# Patient Record
Sex: Female | Born: 1975 | Race: Black or African American | Hispanic: No | Marital: Single | Smoking: Never smoker
Health system: Southern US, Community
[De-identification: ages and names within clinical notes are randomized; demographics above are authoritative.]

## PROBLEM LIST (undated history)

## (undated) DIAGNOSIS — J45909 Unspecified asthma, uncomplicated: Secondary | ICD-10-CM

## (undated) DIAGNOSIS — E119 Type 2 diabetes mellitus without complications: Secondary | ICD-10-CM

## (undated) HISTORY — PX: OTHER SURGICAL HISTORY: SHX169

## (undated) HISTORY — PX: TUBAL LIGATION: SHX77

---

## 2003-11-25 ENCOUNTER — Other Ambulatory Visit: Payer: Self-pay

## 2004-05-12 ENCOUNTER — Observation Stay: Payer: Self-pay

## 2004-05-27 ENCOUNTER — Observation Stay: Payer: Self-pay

## 2004-06-07 ENCOUNTER — Inpatient Hospital Stay: Payer: Self-pay | Admitting: Obstetrics & Gynecology

## 2008-01-09 ENCOUNTER — Emergency Department: Payer: Self-pay | Admitting: Emergency Medicine

## 2008-05-22 ENCOUNTER — Emergency Department: Payer: Self-pay | Admitting: Emergency Medicine

## 2010-01-16 ENCOUNTER — Ambulatory Visit: Payer: Self-pay | Admitting: Surgery

## 2010-01-23 ENCOUNTER — Ambulatory Visit: Payer: Self-pay | Admitting: Surgery

## 2010-01-26 LAB — PATHOLOGY REPORT

## 2012-01-06 ENCOUNTER — Emergency Department: Payer: Self-pay | Admitting: Internal Medicine

## 2012-01-16 ENCOUNTER — Emergency Department: Payer: Self-pay | Admitting: Emergency Medicine

## 2012-12-31 ENCOUNTER — Emergency Department: Payer: Self-pay | Admitting: Internal Medicine

## 2013-06-27 ENCOUNTER — Emergency Department: Payer: Self-pay | Admitting: Emergency Medicine

## 2013-06-27 LAB — COMPREHENSIVE METABOLIC PANEL
ALBUMIN: 3.5 g/dL (ref 3.4–5.0)
ALT: 18 U/L (ref 12–78)
Alkaline Phosphatase: 81 U/L
Anion Gap: 5 — ABNORMAL LOW (ref 7–16)
BUN: 10 mg/dL (ref 7–18)
Bilirubin,Total: 0.5 mg/dL (ref 0.2–1.0)
CALCIUM: 9 mg/dL (ref 8.5–10.1)
Chloride: 103 mmol/L (ref 98–107)
Co2: 29 mmol/L (ref 21–32)
Creatinine: 0.9 mg/dL (ref 0.60–1.30)
Glucose: 98 mg/dL (ref 65–99)
Osmolality: 273 (ref 275–301)
Potassium: 3.5 mmol/L (ref 3.5–5.1)
SGOT(AST): 14 U/L — ABNORMAL LOW (ref 15–37)
Sodium: 137 mmol/L (ref 136–145)
Total Protein: 8 g/dL (ref 6.4–8.2)

## 2013-06-27 LAB — CBC WITH DIFFERENTIAL/PLATELET
Basophil #: 0.1 10*3/uL (ref 0.0–0.1)
Basophil %: 1 %
EOS ABS: 0.2 10*3/uL (ref 0.0–0.7)
Eosinophil %: 1.7 %
HCT: 39.1 % (ref 35.0–47.0)
HGB: 12.8 g/dL (ref 12.0–16.0)
Lymphocyte #: 3.1 10*3/uL (ref 1.0–3.6)
Lymphocyte %: 34.2 %
MCH: 30 pg (ref 26.0–34.0)
MCHC: 32.7 g/dL (ref 32.0–36.0)
MCV: 92 fL (ref 80–100)
MONO ABS: 0.7 x10 3/mm (ref 0.2–0.9)
Monocyte %: 8.1 %
NEUTROS ABS: 5 10*3/uL (ref 1.4–6.5)
Neutrophil %: 55 %
Platelet: 271 10*3/uL (ref 150–440)
RBC: 4.26 10*6/uL (ref 3.80–5.20)
RDW: 13.4 % (ref 11.5–14.5)
WBC: 9 10*3/uL (ref 3.6–11.0)

## 2013-06-27 LAB — CK TOTAL AND CKMB (NOT AT ARMC)
CK, TOTAL: 206 U/L — AB
CK-MB: 1.8 ng/mL (ref 0.5–3.6)

## 2013-06-27 LAB — TROPONIN I: Troponin-I: <0.02 ng/mL

## 2015-08-07 IMAGING — CT CT HEAD WITHOUT CONTRAST
1 series · 16 of 29 positions shown, 20 images · non-contrast
Comparison: None.

CLINICAL DATA: Syncope.  Dizziness.

EXAM:
CT HEAD WITHOUT CONTRAST
TECHNIQUE: Contiguous axial images were obtained from the base of the skull
through the vertex without intravenous contrast.

[Series 2: soft tissue · axial · 0.42mm/px · z∈[-176,-46]mm · 16 of 29 slices shown, 20 images]
[im 2/29  brain]
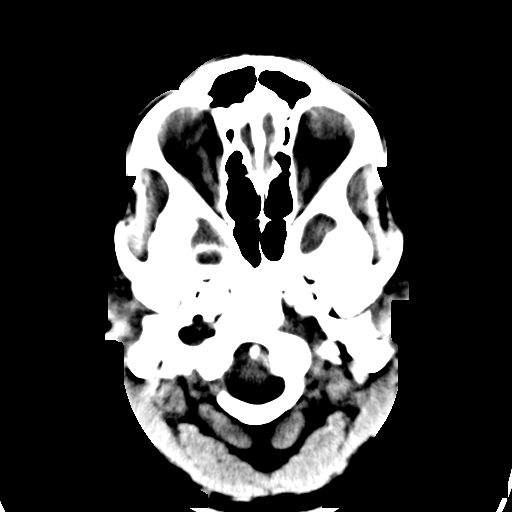
[im 2/29  bone]
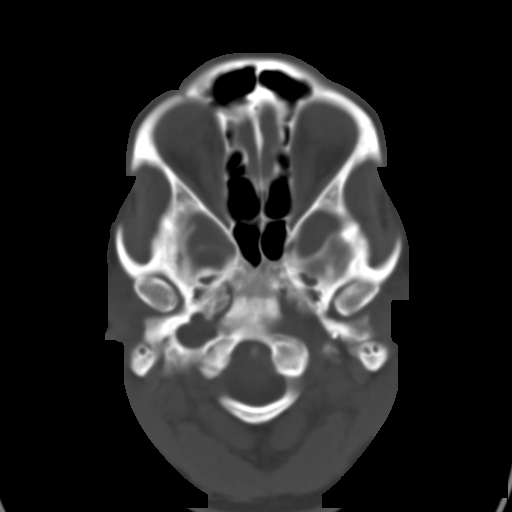
[im 4/29  brain]
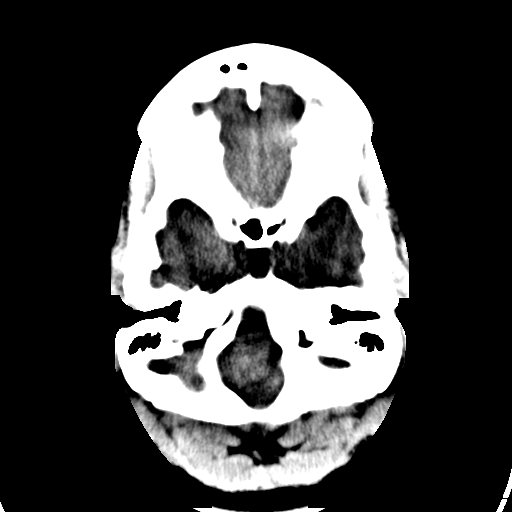
[im 6/29  brain]
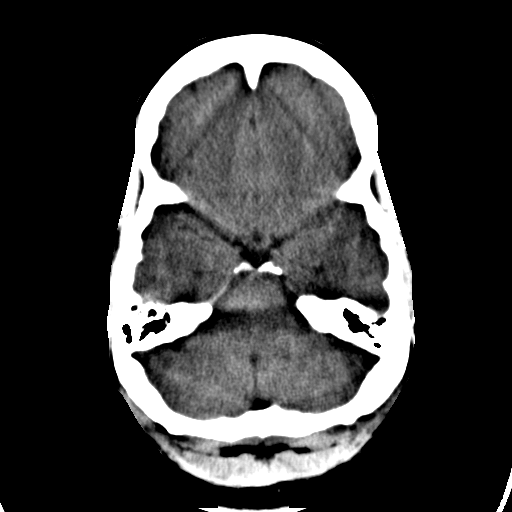
[im 7/29  brain]
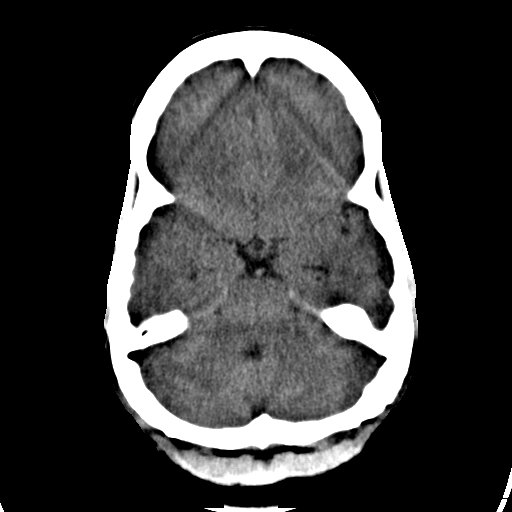
[im 9/29  brain]
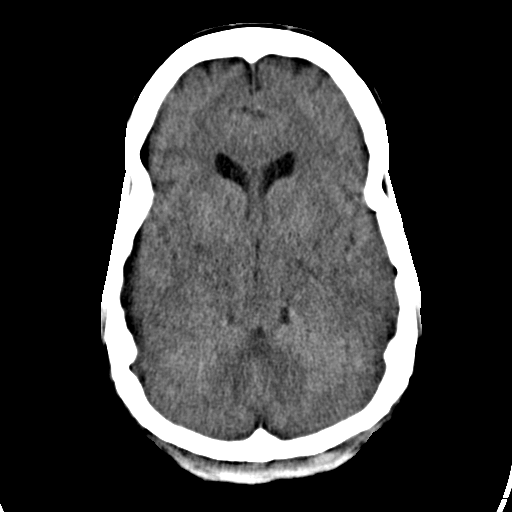
[im 9/29  bone]
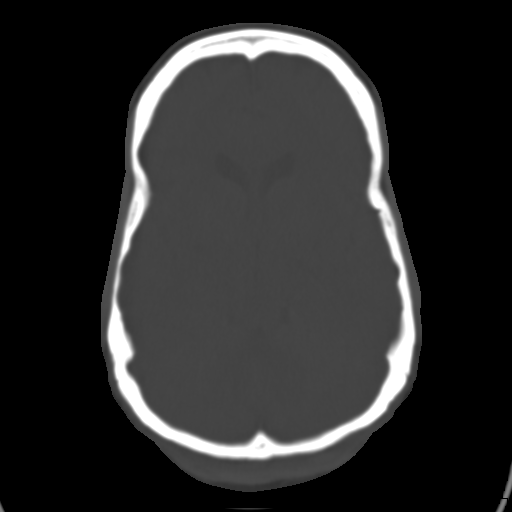
[im 11/29  brain]
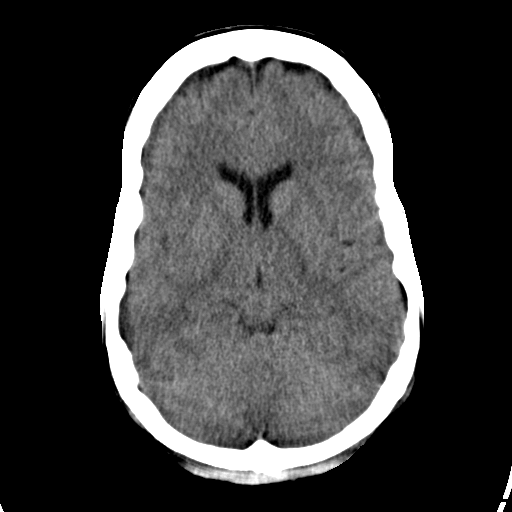
[im 12/29  brain]
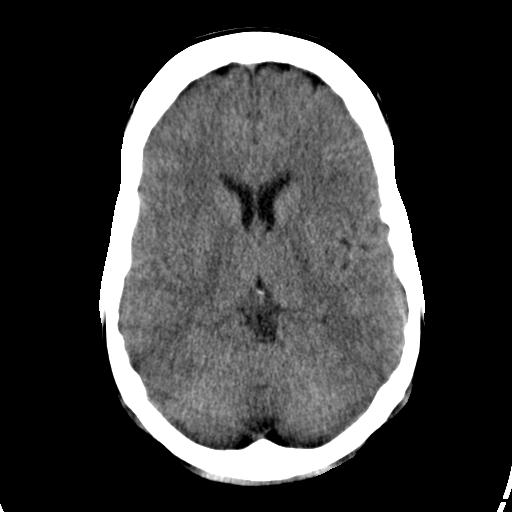
[im 14/29  brain]
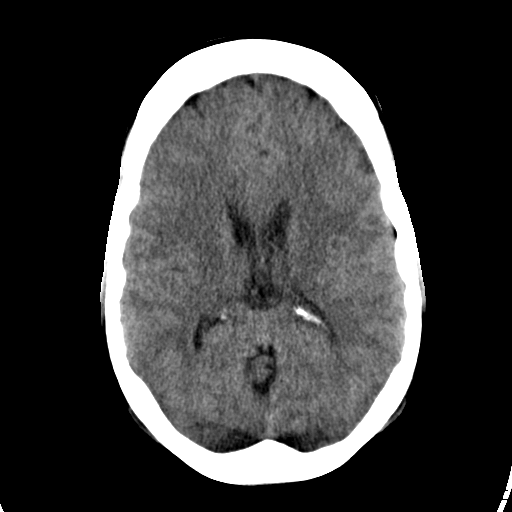
[im 16/29  brain]
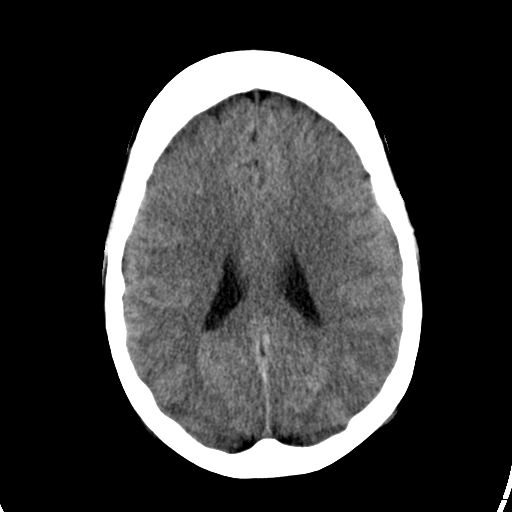
[im 16/29  bone]
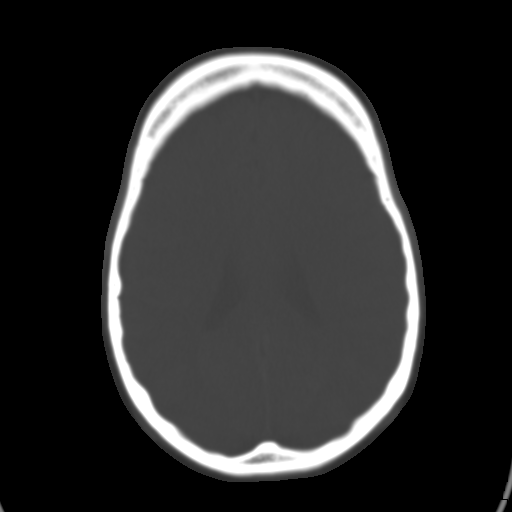
[im 18/29  brain]
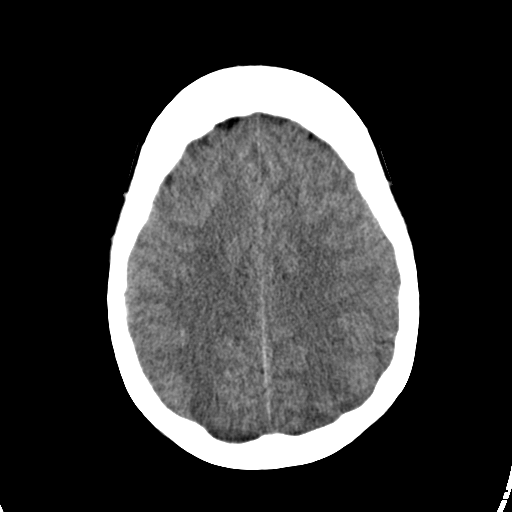
[im 19/29  brain]
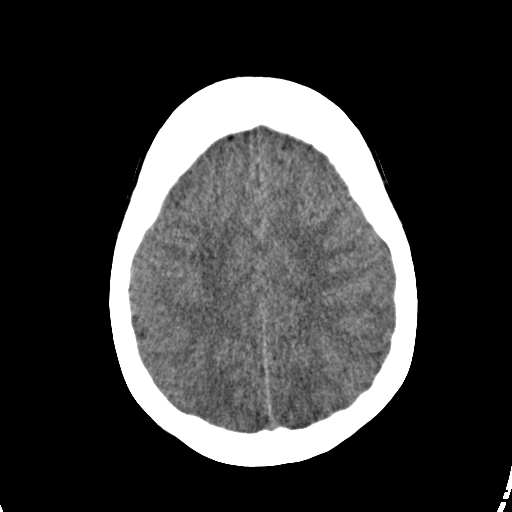
[im 21/29  brain]
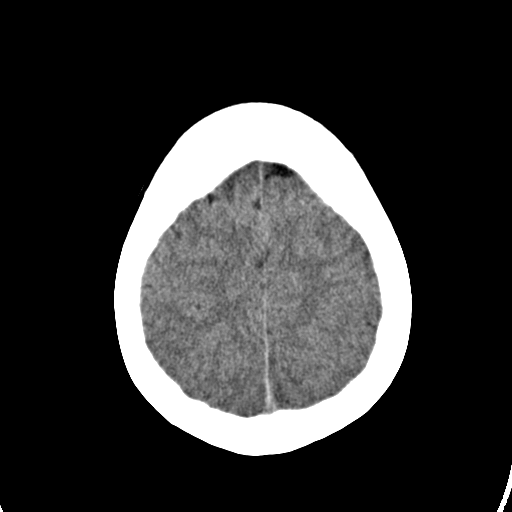
[im 23/29  brain]
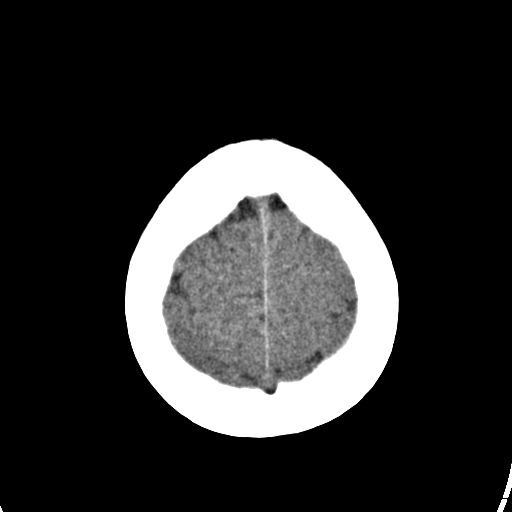
[im 23/29  bone]
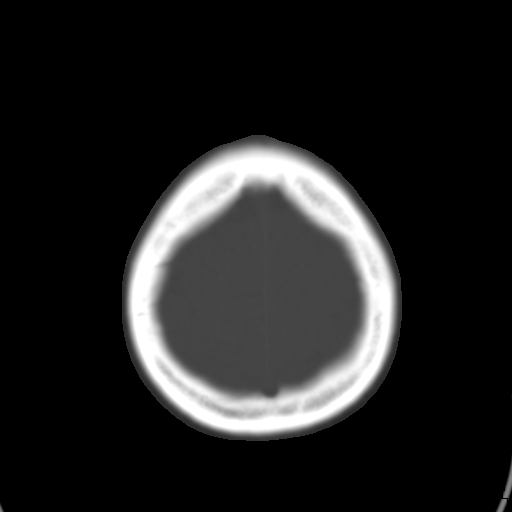
[im 24/29  brain]
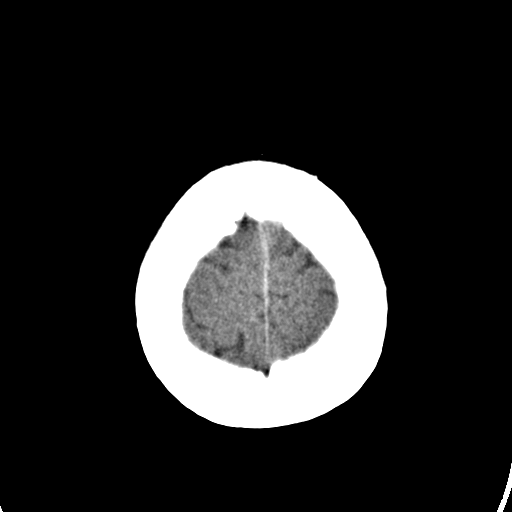
[im 26/29  brain]
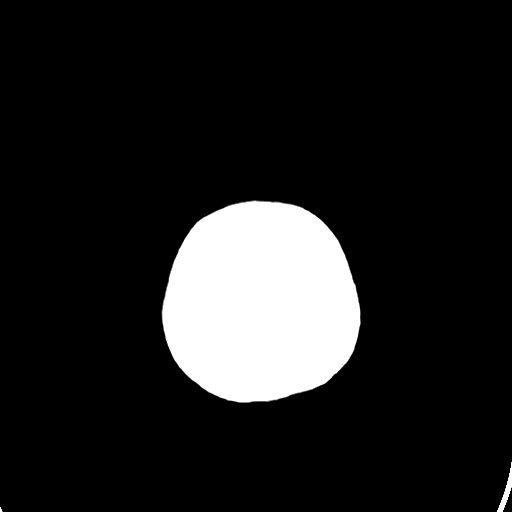
[im 28/29  brain]
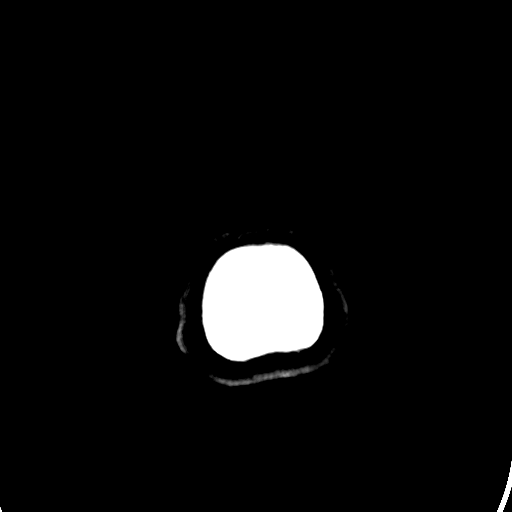

[16 of 29 positions shown; findings below may reference images not displayed]

FINDINGS: The ventricles and sulci are within normal limits for age. There is
no evidence of acute infarct, intracranial hemorrhage, mass, midline
shift, or extra-axial collection. The orbits are unremarkable. The
visualized paranasal sinuses and mastoid air cells are clear. There
is no evidence of acute fracture.
IMPRESSION: Unremarkable head CT.

## 2015-09-29 ENCOUNTER — Encounter: Payer: Self-pay | Admitting: Emergency Medicine

## 2015-09-29 ENCOUNTER — Emergency Department
Admission: EM | Admit: 2015-09-29 | Discharge: 2015-09-29 | Disposition: A | Discharge status: Home / Self Care | Payer: Medicaid Other | Attending: Emergency Medicine | Admitting: Emergency Medicine

## 2015-09-29 DIAGNOSIS — B3731 Acute candidiasis of vulva and vagina: Secondary | ICD-10-CM

## 2015-09-29 DIAGNOSIS — IMO0002 Reserved for concepts with insufficient information to code with codable children: Secondary | ICD-10-CM

## 2015-09-29 DIAGNOSIS — E1065 Type 1 diabetes mellitus with hyperglycemia: Secondary | ICD-10-CM | POA: Insufficient documentation

## 2015-09-29 DIAGNOSIS — B373 Candidiasis of vulva and vagina: Secondary | ICD-10-CM | POA: Diagnosis not present

## 2015-09-29 DIAGNOSIS — Z7984 Long term (current) use of oral hypoglycemic drugs: Secondary | ICD-10-CM | POA: Insufficient documentation

## 2015-09-29 DIAGNOSIS — N898 Other specified noninflammatory disorders of vagina: Secondary | ICD-10-CM | POA: Diagnosis present

## 2015-09-29 DIAGNOSIS — E1069 Type 1 diabetes mellitus with other specified complication: Secondary | ICD-10-CM | POA: Insufficient documentation

## 2015-09-29 LAB — CBC WITH DIFFERENTIAL/PLATELET
BASOS ABS: 0.1 10*3/uL (ref 0–0.1)
Basophils Relative: 1 %
EOS ABS: 0.1 10*3/uL (ref 0–0.7)
EOS PCT: 1 %
HCT: 39.2 % (ref 35.0–47.0)
Hemoglobin: 13.5 g/dL (ref 12.0–16.0)
LYMPHS PCT: 28 %
Lymphs Abs: 3 10*3/uL (ref 1.0–3.6)
MCH: 31 pg (ref 26.0–34.0)
MCHC: 34.3 g/dL (ref 32.0–36.0)
MCV: 90.4 fL (ref 80.0–100.0)
MONO ABS: 0.5 10*3/uL (ref 0.2–0.9)
Monocytes Relative: 5 %
Neutro Abs: 6.8 10*3/uL — ABNORMAL HIGH (ref 1.4–6.5)
Neutrophils Relative %: 65 %
PLATELETS: 246 10*3/uL (ref 150–440)
RBC: 4.34 MIL/uL (ref 3.80–5.20)
RDW: 13.2 % (ref 11.5–14.5)
WBC: 10.5 10*3/uL (ref 3.6–11.0)

## 2015-09-29 LAB — URINALYSIS COMPLETE WITH MICROSCOPIC (ARMC ONLY)
BACTERIA UA: NONE SEEN
BILIRUBIN URINE: NEGATIVE
Glucose, UA: >500 mg/dL — AB
HGB URINE DIPSTICK: NEGATIVE
LEUKOCYTES UA: NEGATIVE
NITRITE: NEGATIVE
PH: 6 (ref 5.0–8.0)
PROTEIN: NEGATIVE mg/dL
SQUAMOUS EPITHELIAL / LPF: NONE SEEN
Specific Gravity, Urine: 1.025 (ref 1.005–1.030)

## 2015-09-29 LAB — BASIC METABOLIC PANEL
ANION GAP: 10 (ref 5–15)
BUN: 15 mg/dL (ref 6–20)
CALCIUM: 9.2 mg/dL (ref 8.9–10.3)
CO2: 22 mmol/L (ref 22–32)
Chloride: 93 mmol/L — ABNORMAL LOW (ref 101–111)
Creatinine, Ser: 0.89 mg/dL (ref 0.44–1.00)
GFR calc Af Amer: >60 mL/min (ref >60)
GLUCOSE: 536 mg/dL — AB (ref 65–99)
Potassium: 4 mmol/L (ref 3.5–5.1)
SODIUM: 125 mmol/L — AB (ref 135–145)

## 2015-09-29 LAB — GLUCOSE, CAPILLARY
GLUCOSE-CAPILLARY: 564 mg/dL — AB (ref 65–99)
Glucose-Capillary: 337 mg/dL — ABNORMAL HIGH (ref 65–99)

## 2015-09-29 LAB — POCT PREGNANCY, URINE: PREG TEST UR: NEGATIVE

## 2015-09-29 MED ORDER — SODIUM CHLORIDE 0.9 % IV BOLUS (SEPSIS)
1000.0000 mL | Freq: Once | INTRAVENOUS | Status: AC
  Administered 2015-09-29: 1000 mL via INTRAVENOUS

## 2015-09-29 MED ORDER — FLUCONAZOLE 100 MG PO TABS: 100.0000 mg | ORAL_TABLET | Freq: Every day | ORAL | Status: DC

## 2015-09-29 NOTE — Discharge Instructions (Signed)
Diabetes Mellitus and Food It is important for you to manage your blood sugar (glucose) level. Your blood glucose level can be greatly affected by what you eat. Eating healthier foods in the appropriate amounts throughout the day at about the same time each day will help you control your blood glucose level. It can also help slow or prevent worsening of your diabetes mellitus. Healthy eating may even help you improve the level of your blood pressure and reach or maintain a healthy weight.  General recommendations for healthful eating and cooking habits include:  Eating meals and snacks regularly. Avoid going long periods of time without eating to lose weight.  Eating a diet that consists mainly of plant-based foods, such as fruits, vegetables, nuts, legumes, and whole grains.  Using low-heat cooking methods, such as baking, instead of high-heat cooking methods, such as deep frying. Work with your dietitian to make sure you understand how to use the Nutrition Facts information on food labels. HOW CAN FOOD AFFECT ME? Carbohydrates Carbohydrates affect your blood glucose level more than any other type of food. Your dietitian will help you determine how many carbohydrates to eat at each meal and teach you how to count carbohydrates. Counting carbohydrates is important to keep your blood glucose at a healthy level, especially if you are using insulin or taking certain medicines for diabetes mellitus. Alcohol Alcohol can cause sudden decreases in blood glucose (hypoglycemia), especially if you use insulin or take certain medicines for diabetes mellitus. Hypoglycemia can be a life-threatening condition. Symptoms of hypoglycemia (sleepiness, dizziness, and disorientation) are similar to symptoms of having too much alcohol.  If your health care provider has given you approval to drink alcohol, do so in moderation and use the following guidelines:  Women should not have more than one drink per day, and men  should not have more than two drinks per day. One drink is equal to:  12 oz of beer.  5 oz of wine.  1 oz of hard liquor.  Do not drink on an empty stomach.  Keep yourself hydrated. Have water, diet soda, or unsweetened iced tea.  Regular soda, juice, and other mixers might contain a lot of carbohydrates and should be counted. WHAT FOODS ARE NOT RECOMMENDED? As you make food choices, it is important to remember that all foods are not the same. Some foods have fewer nutrients per serving than other foods, even though they might have the same number of calories or carbohydrates. It is difficult to get your body what it needs when you eat foods with fewer nutrients. Examples of foods that you should avoid that are high in calories and carbohydrates but low in nutrients include:  Trans fats (most processed foods list trans fats on the Nutrition Facts label).  Regular soda.  Juice.  Candy.  Sweets, such as cake, pie, doughnuts, and cookies.  Fried foods. WHAT FOODS CAN I EAT? Eat nutrient-rich foods, which will nourish your body and keep you healthy. The food you should eat also will depend on several factors, including:  The calories you need.  The medicines you take.  Your weight.  Your blood glucose level.  Your blood pressure level.  Your cholesterol level. You should eat a variety of foods, including:  Protein.  Lean cuts of meat.  Proteins low in saturated fats, such as fish, egg whites, and beans. Avoid processed meats.  Fruits and vegetables.  Fruits and vegetables that may help control blood glucose levels, such as apples, mangoes, and  yams.  Dairy products.  Choose fat-free or low-fat dairy products, such as milk, yogurt, and cheese.  Grains, bread, pasta, and rice.  Choose whole grain products, such as multigrain bread, whole oats, and brown rice. These foods may help control blood pressure.  Fats.  Foods containing healthful fats, such as nuts,  avocado, olive oil, canola oil, and fish. DOES EVERYONE WITH DIABETES MELLITUS HAVE THE SAME MEAL PLAN? Because every person with diabetes mellitus is different, there is not one meal plan that works for everyone. It is very important that you meet with a dietitian who will help you create a meal plan that is just right for you.   This information is not intended to replace advice given to you by your health care provider. Make sure you discuss any questions you have with your health care provider.   Document Released: 12/10/2004 Document Revised: 04/05/2014 Document Reviewed: 02/09/2013 Elsevier Interactive Patient Education 2016 Elsevier Inc[employer]  Daily Diabetes Record Check your blood glucose (BG) as directed by your health care provider. Use this form to record your results as well as any diabetes medicines you take, including insulin. Checking your BG, recording it, and bringing your records to your health care provider is very helpful in managing your diabetes. These numbers help your health care provider know if any changes are needed to your diabetes plan.  Week of _____________________________ Date: _________  Chelsea Nash, BG/Medicines: ________________ / __________________________________________________________  LUNCH, BG/Medicines: ____________________ / __________________________________________________________  Chelsea Nash, BG/Medicines: ___________________ / __________________________________________________________  BEDTIME, BG/Medicines: __________________ / __________________________________________________________ Date: _________  Chelsea Nash, BG/Medicines: ________________ / __________________________________________________________  LUNCH, BG/Medicines: ____________________ / __________________________________________________________  Chelsea Nash, BG/Medicines: ___________________ / __________________________________________________________  BEDTIME, BG/Medicines: __________________  / __________________________________________________________ Date: _________  Chelsea Nash, BG/Medicines: ________________ / __________________________________________________________  LUNCH, BG/Medicines: ____________________ / __________________________________________________________  Chelsea Nash, BG/Medicines: ___________________ / __________________________________________________________  BEDTIME, BG/Medicines: __________________ / __________________________________________________________ Date: _________  Chelsea Nash, BG/Medicines: ________________ / __________________________________________________________  LUNCH, BG/Medicines: ____________________ / __________________________________________________________  Chelsea Nash, BG/Medicines: ___________________ / __________________________________________________________  BEDTIME, BG/Medicines: __________________ / __________________________________________________________ Date: _________  Chelsea Nash, BG/Medicines: ________________ / __________________________________________________________  LUNCH, BG/Medicines: ____________________ / __________________________________________________________  Chelsea Nash, BG/Medicines: ___________________ / __________________________________________________________  BEDTIME, BG/Medicines: __________________ / __________________________________________________________ Date: _________  Chelsea Nash, BG/Medicines: ________________ / __________________________________________________________  LUNCH, BG/Medicines: ____________________ / __________________________________________________________  Chelsea Nash, BG/Medicines: ___________________ / __________________________________________________________  BEDTIME, BG/Medicines: __________________ / __________________________________________________________ Date: _________  Chelsea Nash, BG/Medicines: ________________ /  __________________________________________________________  LUNCH, BG/Medicines: ____________________ / __________________________________________________________  Chelsea Nash, BG/Medicines: ___________________ / __________________________________________________________  BEDTIME, BG/Medicines: __________________ / __________________________________________________________ Notes: __________________________________________________________________________________________________   This information is not intended to replace advice given to you by your health care provider. Make sure you discuss any questions you have with your health care provider.   Document Released: 02/17/2004 Document Revised: 04/05/2014 Document Reviewed: 05/09/2013 Elsevier Interactive Patient Education 2016 Elsevier Inc.  Hyperglycemia High blood sugar (hyperglycemia) means that the level of sugar in your blood is higher than it should be. Signs of high blood sugar include:  Feeling thirsty.  Frequent peeing (urinating).  Feeling tired or sleepy.  Dry mouth.  Vision changes.  Feeling weak.  Feeling hungry but losing weight.  Numbness and tingling in your hands or feet.  Headache. When you ignore these signs, your blood sugar may keep going up. These problems may get worse, and other problems may begin. HOME CARE  Check your blood sugars as told by your doctor. Write down the numbers with the date and time.  Take the right amount of insulin or diabetes pills at the right time. Write down the dose with  date and time.  Refill your insulin or diabetes pills before running out.  Watch what you eat. Follow your meal plan.  Drink liquids without sugar, such as water. Check with your doctor if you have kidney or heart disease.  Follow your doctor's orders for exercise. Exercise at the same time of day.  Keep your doctor's appointments. GET HELP RIGHT AWAY IF:   You have trouble thinking or are  confused.  You have fast breathing with fruity smelling breath.  You pass out (faint).  You have 2 to 3 days of high blood sugars and you do not know why.  You have chest pain.  You are feeling sick to your stomach (nauseous) or throwing up (vomiting).  You have sudden vision changes. MAKE SURE YOU:   Understand these instructions.  Will watch your condition.  Will get help right away if you are not doing well or get worse.   This information is not intended to replace advice given to you by your health care provider. Make sure you discuss any questions you have with your health care provider.   Document Released: 01/10/2009 Document Revised: 04/05/2014 Document Reviewed: 11/19/2014 Elsevier Interactive Patient Education 2016 [employer]  How to Avoid Diabetes Problems You can do a lot to prevent or slow down diabetes problems. Following your diabetes plan and taking care of yourself can reduce your risk of serious or life-threatening complications. Below, you will find certain things you can do to prevent diabetes problems. MANAGE YOUR DIABETES Follow your health care provider's, nurse educator's, and dietitian's instructions for managing your diabetes. They will teach you the basics of diabetes care. They can help answer questions you may have. Learn about diabetes and make healthy choices regarding eating and physical activity. Monitor your blood glucose level regularly. Your health care provider will help you decide how often to check your blood glucose level depending on your treatment goals and how well you are meeting them.  DO NOT USE NICOTINE Nicotine and diabetes are a dangerous combination. Nicotine raises your risk for diabetes problems. If you quit using nicotine, you will lower your risk for heart attack, stroke, nerve disease, and kidney disease. Your cholesterol and your blood pressure levels may improve. Your blood circulation will also improve. Do not use any  tobacco products, including cigarettes, chewing tobacco, or electronic cigarettes. If you need help quitting, ask your health care provider. KEEP YOUR BLOOD PRESSURE UNDER CONTROL Your health care provider will determine your individualized target blood pressure based on your age, your medicines, how long you have had diabetes, and any other medical conditions you have. Blood pressure consists of two numbers. Generally, the goal is to keep your top number (systolic pressure) at or below 130, and your bottom number (diastolic pressure) at or below 80. Your health care provider may recommend a lower target blood pressure reading, if appropriate. Meal planning, medicines, and exercise can help you reach your target blood pressure. Make sure your health care provider checks your blood pressure at every visit. KEEP YOUR CHOLESTEROL UNDER CONTROL Normal cholesterol levels will help prevent heart disease and stroke. These are the biggest health problems for people with diabetes. Keeping cholesterol levels under control can also help with blood flow. Have your cholesterol level checked at least once a year. Your health care provider may prescribe a medicine known as a statin. Statins lower your cholesterol. If you are not taking a statin, ask your health care provider if you should be. Meal planning,  exercise, and medicines can help you reach your cholesterol targets.  SCHEDULE AND KEEP YOUR ANNUAL PHYSICAL EXAMS AND EYE EXAMS Your health care provider will tell you how often he or she wants to see you depending on your plan of treatment. It is important that you keep these appointments so that possible problems can be identified early and complications can be avoided or treated.  Every visit with your health care provider should include your weight, blood pressure, and an evaluation of your blood glucose control.  Your hemoglobin A1c should be checked:  At least twice a year if you are at your goal.  Every 3  months if there are changes in treatment.  If you are not meeting your goals.  Your blood lipids should be checked yearly. You should also be checked yearly to see if you have protein in your urine (microalbumin).  Schedule a dilated eye exam within 5 years of your diagnosis if you have type 1 diabetes, and then yearly. Schedule a dilated eye exam at diagnosis if you have type 2 diabetes, and then yearly. All exams thereafter can be extended to every 2 to 3 years if one or more exams have been normal. KEEP YOUR VACCINES CURRENT It is recommended that you receive a flu (influenza) vaccine every year. It is also recommended that you receive a pneumonia (pneumococcal) vaccine. If you are 83 years of age or older and have never received a pneumonia vaccine, this vaccine may be given as a series of two separate shots. Ask your health care provider which additional vaccines may be recommended. TAKE CARE OF YOUR FEET  Diabetes may cause you to have a poor blood supply (circulation) to your legs and feet. Because of this, the skin may be thinner, break easier, and heal more slowly. You also may have nerve damage in your legs and feet, causing decreased feeling. You may not notice minor injuries to your feet that could lead to serious problems or infections. Taking care of your feet is very important. Visual foot exams are performed at every routine medical visit. The exams check for cuts, injuries, or other problems with the feet. A comprehensive foot exam should be done yearly. This includes visual inspection as well as assessing foot pulses and testing for loss of sensation. You should also do the following:  Inspect your feet daily for cuts, calluses, blisters, ingrown toenails, and signs of infection, such as redness, swelling, or pus.  Wash and dry your feet thoroughly, especially between the toes.  Avoid soaking your feet regularly in hot water baths.  Moisturize dry skin with lotion, avoiding areas  between your toes.  Cut toenails straight across and file the edges.  Avoid shoes that do not fit well or have areas that irritate your skin.  Avoid going barefooted or wearing only socks. Your feet need protection. TAKE CARE OF YOUR TEETH People with poorly controlled diabetes are more likely to have gum (periodontal) disease. These infections make diabetes harder to control. Periodontal diseases, if left untreated, can lead to tooth loss. Brush your teeth twice a day, floss, and see your dentist for checkups and cleaning every 6 months, or 2 times a year. ASK YOUR HEALTH CARE PROVIDER ABOUT TAKING ASPIRIN Taking aspirin daily is recommended to help prevent cardiovascular disease in people with and without diabetes. Ask your health care provider if this would benefit you and what dose he or she would recommend. DRINK RESPONSIBLY Moderate amounts of alcohol (less than 1 drink  per day for adult women and less than 2 drinks per day for adult men) have a minimal effect on blood glucose if ingested with food. It is important to eat food with alcohol to avoid hypoglycemia. People should avoid alcohol if they have a history of alcohol abuse or dependence, if they are pregnant, and if they have liver disease, pancreatitis, advanced neuropathy, or severe hypertriglyceridemia. LESSEN STRESS Living with diabetes can be stressful. When you are under stress, your blood glucose may be affected in two ways:  Stress hormones may cause your blood glucose to rise.  You may be distracted from taking good care of yourself. It is a good idea to be aware of your stress level and make changes that are necessary to help you better manage challenging situations. Support groups, planned relaxation, a hobby you enjoy, meditation, healthy relationships, and exercise all work to lower your stress level. If your efforts do not seem to be helping, get help from your health care provider or a trained mental health  professional.   This information is not intended to replace advice given to you by your health care provider. Make sure you discuss any questions you have with your health care provider.   Document Released: 12/01/2010 Document Revised: 04/05/2014 Document Reviewed: 05/09/2013 Elsevier Interactive Patient Education 2016 Elsevier Inc.  Monilial Vaginitis Vaginitis in a soreness, swelling and redness (inflammation) of the vagina and vulva. Monilial vaginitis is not a sexually transmitted infection. CAUSES  Yeast vaginitis is caused by yeast (candida) that is normally found in your vagina. With a yeast infection, the candida has overgrown in number to a point that upsets the chemical balance. SYMPTOMS   White, thick vaginal discharge.  Swelling, itching, redness and irritation of the vagina and possibly the lips of the vagina (vulva).  Burning or painful urination.  Painful intercourse. DIAGNOSIS  Things that may contribute to monilial vaginitis are:  Postmenopausal and virginal states.  Pregnancy.  Infections.  Being tired, sick or stressed, especially if you had monilial vaginitis in the past.  Diabetes. Good control will help lower the chance.  Birth control pills.  Tight fitting garments.  Using bubble bath, feminine sprays, douches or deodorant tampons.  Taking certain medications that kill germs (antibiotics).  Sporadic recurrence can occur if you become ill. TREATMENT  Your caregiver will give you medication.  There are several kinds of anti monilial vaginal creams and suppositories specific for monilial vaginitis. For recurrent yeast infections, use a suppository or cream in the vagina 2 times a week, or as directed.  Anti-monilial or steroid cream for the itching or irritation of the vulva may also be used. Get your caregiver's permission.  Painting the vagina with methylene blue solution may help if the monilial cream does not work.  Eating yogurt may help  prevent monilial vaginitis. HOME CARE INSTRUCTIONS   Finish all medication as prescribed.  Do not have sex until treatment is completed or after your caregiver tells you it is okay.  Take warm sitz baths.  Do not douche.  Do not use tampons, especially scented ones.  Wear cotton underwear.  Avoid tight pants and panty hose.  Tell your sexual partner that you have a yeast infection. They should go to their caregiver if they have symptoms such as mild rash or itching.  Your sexual partner should be treated as well if your infection is difficult to eliminate.  Practice safer sex. Use condoms.  Some vaginal medications cause latex condoms to fail. Vaginal  medications that harm condoms are:  Cleocin cream.  Butoconazole (Femstat).  Terconazole (Terazol) vaginal suppository.  Miconazole (Monistat) (may be purchased over the counter). SEEK MEDICAL CARE IF:   You have a temperature by mouth above 102 F (38.9 C).  The infection is getting worse after 2 days of treatment.  The infection is not getting better after 3 days of treatment.  You develop blisters in or around your vagina.  You develop vaginal bleeding, and it is not your menstrual period.  You have pain when you urinate.  You develop intestinal problems.  You have pain with sexual intercourse.   This information is not intended to replace advice given to you by your health care provider. Make sure you discuss any questions you have with your health care provider.   Document Released: 12/23/2004 Document Revised: 06/07/2011 Document Reviewed: 09/16/2014 Elsevier Interactive Patient Education 2016 Else[employer] Inc.  Probiotics WHAT ARE PROBIOTICS? Probiotics are the good bacteria and yeasts that live in your body and keep you and your digestive system healthy. Probiotics also help your body's defense (immune) system and protect your body against bad bacterial growth.  Certain foods contain probiotics, such as  yogurt. Probiotics can also be purchased as a supplement. As with any supplement or drug, it is important to discuss its use with your health care provider.  WHAT AFFECTS THE BALANCE OF BACTERIA IN MY BODY? The balance of bacteria in your body can be affected by:   Antibiotic medicines. Antibiotics are sometimes necessary to treat infection. Unfortunately, they may kill good or friendly bacteria in your body as well as the bad bacteria. This may lead to stomach problems like diarrhea, gas, and cramping.  Disease. Some conditions are the result of an overgrowth of bad bacteria, yeasts, parasites, or fungi. These conditions include:   Infectious diarrhea.  Stomach and respiratory infections.  Skin infections.  Irritable bowel syndrome (IBS).  Inflammatory bowel diseases.  Ulcer due to Helicobacter pylori (H. pylori) infection.  Tooth decay and periodontal disease.  Vaginal infections. Stress and poor diet may also lower the good bacteria in your body.  WHAT TYPE OF PROBIOTIC IS RIGHT FOR ME? Probiotics are available over the counter at your local pharmacy, health food, or grocery store. They come in many different forms, combinations of strains, and dosing strengths. Some may need to be refrigerated. Always read the label for storage and usage instructions. Specific strains have been shown to be more effective for certain conditions. Ask your health care provider what option is best for you.  WHY WOULD I NEED PROBIOTICS? There are many reasons your health care provider might recommend a probiotic supplement, including:   Diarrhea.  Constipation.  IBS.  Respiratory infections.  Yeast infections.  Acne, eczema, and other skin conditions.  Frequent urinary tract infections (UTIs). ARE THERE SIDE EFFECTS OF PROBIOTICS? Some people experience mild side effects when taking probiotics. Side effects are usually temporary and may include:   Gas.  Bloating.  Cramping. Rarely,  serious side effects, such as infection or immune system changes, may occur. WHAT ELSE DO I NEED TO KNOW ABOUT PROBIOTICS?   There are many different strains of probiotics. Certain strains may be more effective depending on your condition. Probiotics are available in varying doses. Ask your health care provider which probiotic you should use and how often.   If you are taking probiotics along with antibiotics, it is generally recommended to wait at least 2 hours between taking the antibiotic and taking the  probiotic.  FOR MORE INFORMATION:  [facility] [email or web address]   This information is not intended to replace advice given to you by your health care provider. Make sure you discuss any questions you have with your health care provider.   Document Released: 10/10/2013 Document Reviewed: 10/10/2013 Elsevier Interactive Patient Education 2016 [employer].  Type 1 Diabetes Mellitus, Adult Type 1 diabetes mellitus, often simply referred to as diabetes, is a long-term (chronic) disease. It occurs when the islet cells in the pancreas that make insulin (a hormone) are destroyed and can no longer make insulin. Insulin is needed to move sugars from food into the tissue cells. The tissue cells use the sugars for energy. In people with type 1 diabetes, the sugars build up in the blood instead of going into the tissue cells. As a result, high blood sugar (hyperglycemia) develops. Without insulin, the body breaks down fat cells for the needed energy. This breakdown of fat cells produces acid chemicals (ketones), which increases the acid levels in the body. The effect of either high ketone or high sugar (glucose) levels can be life-threatening.  Type 1 diabetes was also previously called juvenile diabetes. It most often occurs before the age of 8, but it can occur at any age. RISK FACTORS A person is predisposed to developing type 1 diabetes if someone  in his or her family has the disease and is exposed to certain additional environmental triggers.  SYMPTOMS  Symptoms of type 1 diabetes may develop gradually over days to weeks or suddenly. The symptoms occur due to hyperglycemia. The symptoms can include:   Increased thirst (polydipsia).  Increased urination (polyuria).  Increased urination during the night (nocturia).  Weight loss. This weight loss may be rapid.  Frequent, recurring infections.  Tiredness (fatigue).  Weakness.  Vision changes, such as blurred vision.  Fruity smell to your breath.  Abdominal pain.  Nausea or vomiting.  An open skin wound (ulcer). DIAGNOSIS  Type 1 diabetes is diagnosed when symptoms of diabetes are present and when blood glucose levels are increased. Your blood glucose level may be checked by one or more of the following blood tests:  A fasting blood glucose test. You will not be allowed to eat for at least 8 hours before a blood sample is taken.  A random blood glucose test. Your blood glucose is checked at any time of the day regardless of when you ate.  A hemoglobin A1c blood glucose test. A hemoglobin A1c test provides information about blood glucose control over the previous 3 months. TREATMENT  Although type 1 diabetes cannot be prevented, it can be managed with insulin, diet, and exercise.  You will need to take insulin daily to keep blood glucose in the desired range.  You will need to match insulin dosing with exercise and healthy food choices. Generally, the goal of treatment is to maintain a pre-meal (preprandial) blood glucose level of 80-130 mg/dL. HOME CARE INSTRUCTIONS   Have your hemoglobin A1c level checked twice a year.  Perform daily blood glucose monitoring as directed by your health care provider.  Monitor urine ketones when you are ill and as directed by your health care provider.  Take your insulin as directed by your health care provider to maintain your  blood glucose level in the desired range.  Never run out of insulin. It is needed every day.  Adjust insulin based on your intake of carbohydrates. Carbohydrates can raise blood glucose levels but need to  be included in your diet. Carbohydrates provide vitamins, minerals, and fiber, which are an essential part of a healthy diet. Carbohydrates are found in fruits, vegetables, whole grains, dairy products, legumes, and foods containing added sugars.  Eat healthy foods. Alternate 3 meals with 3 snacks.  Maintain a healthy weight.  Carry a medical alert card or wear your medical alert jewelry.  Carry a 15-gram carbohydrate snack with you at all times to treat low blood glucose (hypoglycemia). Some examples of 15-gram carbohydrate snacks include:  Glucose tablets, 3 or 4.  Glucose gel, 15-gram tube.  Raisins, 2 tablespoons (24 grams).  Jelly beans, 6.  Animal crackers, 8.  Fruit juice, regular soda, or low-fat milk, 4 ounces (120 mL).  Gummy treats, 9.  Recognize hypoglycemia. Hypoglycemia occurs with blood glucose levels of 70 mg/dL and below. The risk for hypoglycemia increases when fasting or skipping meals, during or after intense exercise, and during sleep. Hypoglycemia symptoms can include:  Tremors or shakes.  Decreased ability to concentrate.  Sweating.  Increased heart rate.  Headache.  Dry mouth.  Hunger.  Irritability.  Anxiety.  Restless sleep.  Altered speech or coordination.  Confusion.  Treat hypoglycemia promptly. If you are alert and able to safely swallow, follow the 15:15 rule:  Take 15-20 grams of rapid-acting glucose or carbohydrate. Rapid-acting options include glucose gel, glucose tablets, or 4 ounces (120 mL) of fruit juice, regular soda, or low-fat milk.  Check your blood glucose level 15 minutes after taking the glucose.  Take 15-20 grams more of glucose if the repeat blood glucose level is still 70 mg/dL or below.  Eat a meal or  snack within 1 hour once blood glucose levels return to normal.  Be alert to polyuria and polydipsia, which are early signs of hyperglycemia. An early awareness of hyperglycemia allows for prompt treatment. Treat hyperglycemia as directed by your health care provider.  Exercise regularly as directed by your health care provider. This includes:  Stretching and performing strength training exercises, such as yoga or weight lifting, at least 2 times per week.  Performing a total of at least 150 minutes of moderate-intensity exercise each week, such as brisk walking or water aerobics.  Exercising at least 3 days per week, making sure you allow no more than 2 consecutive days to pass without exercising.  Avoiding long periods of inactivity (90 minutes or more). When you have to spend an extended period of time sitting down, take frequent breaks to walk or stretch.  Adjust your insulin dosing and food intake as needed if you start a new exercise or sport.  Follow your sick-day plan at any time you are unable to eat or drink as usual.   Do not use any tobacco products including cigarettes, chewing tobacco, or electronic cigarettes. If you need help quitting, ask your health care provider.  Limit alcohol intake to no more than 1 drink per day for nonpregnant women and 2 drinks per day for men. You should drink alcohol only when you are also eating food. Talk with your health care provider about whether alcohol is safe for you. Tell your health care provider if you drink alcohol several times a week.  Keep all follow-up visits as directed by your health care provider.  Schedule an eye exam within 5 years of diagnosis and then annually.  Perform daily skin and foot care. Examine your skin and feet daily for cuts, bruises, redness, nail problems, bleeding, blisters, or sores. A foot exam should be  done by a health care provider 5 years after diagnosis, and then every year after the first  exam.  Brush your teeth and gums at least twice a day and floss at least once a day. Follow up with your dentist regularly.  Share your diabetes management plan with your workplace or school.  Keep your immunizations up to date. It is recommended that you receive a flu (influenza) vaccine every year. It is also recommended that you receive a pneumonia (pneumococcal) vaccine. If you are 39 years of age or older and have never received a pneumonia vaccine, this vaccine may be given as a series of two separate shots. Ask your health care provider which additional vaccines may be recommended.  Learn to manage stress.  Obtain ongoing diabetes education and support as needed.  Participate in or seek rehabilitation as needed to maintain or improve independence and quality of life. Request a physical or occupational therapy referral if you are having foot or hand numbness, or difficulties with grooming, dressing, eating, or physical activity. SEEK MEDICAL CARE IF:   You are unable to eat food or drink fluids for more than 6 hours.  You have nausea and vomiting for more than 6 hours.  Your blood glucose level is over 240 mg/dL.  There is a change in mental status.  You develop an additional serious illness.  You have diarrhea for more than 6 hours.  You have been sick or have had a fever for a couple of days and are not getting better.  You have pain during any physical activity. SEEK IMMEDIATE MEDICAL CARE IF:  You have difficulty breathing.  You have moderate to large ketone levels. MAKE SURE YOU:  Understand these instructions.  Will watch your condition.  Will get help right away if you are not doing well or get worse.   This information is not intended to replace advice given to you by your health care provider. Make sure you discuss any questions you have with your health care provider.   Document Released: 03/12/2000 Document Revised: 12/04/2014 Document Reviewed:  10/12/2011 Elsevier Interactive Patient Education [employer]2016 [employer]

## 2015-09-29 NOTE — ED Notes (Signed)
States she developed a possible yeast infection about 5 days ago  States she is tired and having some blurred vision

## 2015-09-29 NOTE — ED Provider Notes (Signed)
[facility] Emergency Department Provider Note  ____________________________________________  Time seen: Approximately 11:52 AM  I have reviewed the triage vital signs and the nursing notes.   HISTORY  Chief Complaint Vaginal Itching    HPI Chelsea Nash is a 40 y.o. female, NAD, presents to the emergency department with complaint of vaginal itching and discharge x 5 days. Has had a thick white discharge that she attempted to treat with monistat without relief. States she has been experiencing increased fatigue, polydipsia and polyuria with increased urinary urgency. She denies headache, fever, dysuria, nor burning with urination. Reports PMHx of DM with increased appetite and blurred vision x 5 days. She saw her eye doctor on Thursday who stated that he did not want to give her corrective lenses because the change in vision was likely a result of her uncontrolled DM. [facility] for [facility] of DM and has previously been prescribed Metformin. She discontinued her metformin as she heard on television that "it can kill you" and she "does not want to die." Does not monitor sugars at home because she does not have a glucometer. Denies nausea, vomiting, diarrhea, nor myalgias. Was constipated for one week until BM yesterday.    History reviewed. No pertinent past medical history.  There are no active problems to display for this patient.   History reviewed. No pertinent past surgical history.  Current Outpatient Rx  Name  Route  Sig  Dispense  Refill  . metFORMIN (GLUCOPHAGE) 500 MG tablet   Oral   Take 500 mg by mouth 2 (two) times daily with a meal.         . fluconazole (DIFLUCAN) 100 MG tablet   Oral   Take 1 tablet (100 mg total) by mouth daily.   2 tablet   0     Allergies Review of patient's allergies indicates no known allergies.  History reviewed. No pertinent family history.  Social History Social History   Substance Use Topics  . Smoking status: Never Smoker   . Smokeless tobacco: None  . Alcohol Use: No     Review of Systems  Constitutional: Positive for fatigue. Negative for fevers, chills, night sweats, weight loss. Eyes: Positive for blurry vision. No vision loss. ENT: No sore throat, dysphagia, hearing changes Cardiovascular: No chest pain or palpitations Respiratory: No cough, shortness of breath. No wheezing.  Gastrointestinal: Positive for constipation that has resolved. No abdominal pain.  No nausea, vomiting.  No diarrhea.  Genitourinary: Positive for increased urinary frequency and urgency with polyuria. Negative for dysuria, burning, or hematuria. Musculoskeletal: Negative for back pain nor myalgias Skin: Negative for rash. Neurological: Negative for headaches, focal weakness or numbness.  Endocrine: Positive for polydipsia, No polyphagia 10-point ROS otherwise negative.  ____________________________________________   PHYSICAL EXAM:  VITAL SIGNS: ED Triage Vitals  Enc Vitals Group     BP 09/29/15 1046 122/88 mmHg     Pulse Rate 09/29/15 1046 105     Resp 09/29/15 1046 18     Temp 09/29/15 1046 98.4 F (36.9 C)     Temp Source 09/29/15 1046 Oral     SpO2 09/29/15 1046 98 %     Weight 09/29/15 1046 209 lb (94.802 kg)     Height 09/29/15 1046 4\' 11"  (1.499 m)     Head Cir --      Peak Flow --      Pain Score 09/29/15 1035 10     Pain Loc --  Pain Edu? --      Excl. in GC? --      Constitutional: Alert and oriented. Well appearing and in no acute distress. Eyes: Conjunctivae are normal. PERRL. EOMI without pain.  Head: Atraumatic, normocephalic ENT:      Nose: No congestion/rhinnorhea.      Mouth/Throat: Mucous membranes are moist. Uvula is midline. Pharynx without erythema, swelling, exudates. Neck: Supple with full range of motion Hematological/Lymphatic/Immunilogical: No cervical or supraclavicular lymphadenopathy. Cardiovascular: Normal rate,  regular rhythm. Normal S1 and S2.  No murmurs, rubs, gallops. Good peripheral circulation with 2+ pulses noted in bilateral upper and lower extremities. Respiratory: Normal respiratory effort without tachypnea or retractions. Lungs CTAB with breath sounds noted in all lung fields. No wheezes, rales, or rhonchi Gastrointestinal: Soft and nontender without distention or guarding in all quadrants. Grossly normal bowel sounds noted in all quadrants.  Musculoskeletal: No lower extremity tenderness nor edema.  No joint effusions.  Neurologic:  Normal speech and language. No gross focal neurologic deficits are appreciated. CN II-XII grossly in tact Skin:  Skin is warm, dry and intact. No rash noted. Psychiatric: Mood and affect are normal. Speech and behavior are normal. Patient exhibits appropriate insight and judgement.   ____________________________________________   LABS (all labs ordered are listed, but only abnormal results are displayed)  Labs Reviewed  URINALYSIS COMPLETEWITH MICROSCOPIC ([facility] ONLY) - Abnormal; Notable for the following:    Color, Urine STRAW (*)    APPearance CLEAR (*)    Glucose, UA >500 (*)    Ketones, ur 1+ (*)    All other components within normal limits  GLUCOSE, CAPILLARY - Abnormal; Notable for the following:    Glucose-Capillary 564 (*)    All other components within normal limits  CBC WITH DIFFERENTIAL/PLATELET - Abnormal; Notable for the following:    Neutro Abs 6.8 (*)    All other components within normal limits  BASIC METABOLIC PANEL - Abnormal; Notable for the following:    Sodium 125 (*)    Chloride 93 (*)    Glucose, Bld 536 (*)    All other components within normal limits  GLUCOSE, CAPILLARY - Abnormal; Notable for the following:    Glucose-Capillary 337 (*)    All other components within normal limits  POC URINE PREG, ED  CBG MONITORING, ED  POCT PREGNANCY, URINE    ____________________________________________  EKG  None ____________________________________________  RADIOLOGY  None ____________________________________________    PROCEDURES  Procedure(s) performed: None    Medications  sodium chloride 0.9 % bolus 1,000 mL (0 mLs Intravenous Stopped 09/29/15 1324)     ____________________________________________   INITIAL IMPRESSION / ASSESSMENT AND PLAN / ED COURSE  Pertinent lab results that were available during my care of the patient were reviewed by me and considered in my medical decision making (see chart for details). Patient's glucose decreased from 536 to 337 after 1 L of fluid. Patient had no adverse effects nor worsening symptoms during her emergency department course.  Patient's diagnosis is consistent with uncontrolled type 1 diabetes with other specified complications and vaginal yeast infection. Patient will be discharged home with prescriptions for fluconazole to take as directed. Patient is advised to restart metformin twice daily as previously prescribed. Patient was given exit care on diabetes mellitus and management including dietary restrictions to review. Patient is to follow up with her primary care provider if symptoms persist past this treatment course. Patient is given ED precautions to return to the ED for any worsening or new  symptoms.      ____________________________________________  FINAL CLINICAL IMPRESSION(S) / ED DIAGNOSES  Final diagnoses:  Uncontrolled type 1 diabetes mellitus with other specified complication (HCC)  Vaginal yeast infection      NEW MEDICATIONS STARTED DURING THIS VISIT:  Discharge Medication List as of 09/29/2015  1:15 PM    START taking these medications   Details  fluconazole (DIFLUCAN) 100 MG tablet Take 1 tablet (100 mg total) by mouth daily., Starting 09/29/2015, Until Discontinued, Print            Hope PigeonJami L Hagler, PA-C 09/29/15 1515  Sharman CheekPhillip Stafford,  MD 10/01/15 1517

## 2015-09-29 NOTE — ED Notes (Signed)
Pt to ed with c/o vaginal itching and white discharge x 5 days.

## 2016-01-28 ENCOUNTER — Encounter: Payer: Self-pay | Admitting: *Deleted

## 2016-01-28 ENCOUNTER — Emergency Department
Admission: EM | Admit: 2016-01-28 | Discharge: 2016-01-28 | Disposition: A | Discharge status: Home / Self Care | Payer: Medicaid Other | Attending: Emergency Medicine | Admitting: Emergency Medicine

## 2016-01-28 DIAGNOSIS — E1165 Type 2 diabetes mellitus with hyperglycemia: Secondary | ICD-10-CM | POA: Diagnosis not present

## 2016-01-28 DIAGNOSIS — N760 Acute vaginitis: Secondary | ICD-10-CM | POA: Insufficient documentation

## 2016-01-28 DIAGNOSIS — B9689 Other specified bacterial agents as the cause of diseases classified elsewhere: Secondary | ICD-10-CM

## 2016-01-28 DIAGNOSIS — J45909 Unspecified asthma, uncomplicated: Secondary | ICD-10-CM | POA: Insufficient documentation

## 2016-01-28 DIAGNOSIS — N898 Other specified noninflammatory disorders of vagina: Secondary | ICD-10-CM | POA: Diagnosis present

## 2016-01-28 DIAGNOSIS — R739 Hyperglycemia, unspecified: Secondary | ICD-10-CM

## 2016-01-28 HISTORY — DX: Unspecified asthma, uncomplicated: J45.909

## 2016-01-28 HISTORY — DX: Type 2 diabetes mellitus without complications: E11.9

## 2016-01-28 LAB — CBC WITH DIFFERENTIAL/PLATELET
BASOS ABS: 0 10*3/uL (ref 0–0.1)
Basophils Relative: 1 %
EOS ABS: 0.1 10*3/uL (ref 0–0.7)
Eosinophils Relative: 1 %
HCT: 40 % (ref 35.0–47.0)
Hemoglobin: 13.8 g/dL (ref 12.0–16.0)
Lymphocytes Relative: 34 %
Lymphs Abs: 2.9 10*3/uL (ref 1.0–3.6)
MCH: 31.1 pg (ref 26.0–34.0)
MCHC: 34.6 g/dL (ref 32.0–36.0)
MCV: 89.9 fL (ref 80.0–100.0)
Monocytes Absolute: 0.5 10*3/uL (ref 0.2–0.9)
Monocytes Relative: 6 %
Neutro Abs: 5 10*3/uL (ref 1.4–6.5)
Neutrophils Relative %: 58 %
Platelets: 269 10*3/uL (ref 150–440)
RBC: 4.45 MIL/uL (ref 3.80–5.20)
RDW: 13.4 % (ref 11.5–14.5)
WBC: 8.5 10*3/uL (ref 3.6–11.0)

## 2016-01-28 LAB — URINALYSIS COMPLETE WITH MICROSCOPIC (ARMC ONLY)
BILIRUBIN URINE: NEGATIVE
Bacteria, UA: NONE SEEN
Hgb urine dipstick: NEGATIVE
LEUKOCYTES UA: NEGATIVE
Nitrite: NEGATIVE
PH: 6 (ref 5.0–8.0)
Protein, ur: NEGATIVE mg/dL
Specific Gravity, Urine: 1.025 (ref 1.005–1.030)

## 2016-01-28 LAB — WET PREP, GENITAL
SPERM: NONE SEEN
Trich, Wet Prep: NONE SEEN
Yeast Wet Prep HPF POC: NONE SEEN

## 2016-01-28 LAB — BASIC METABOLIC PANEL
ANION GAP: 7 (ref 5–15)
BUN: 14 mg/dL (ref 6–20)
CALCIUM: 9.4 mg/dL (ref 8.9–10.3)
CO2: 26 mmol/L (ref 22–32)
Chloride: 101 mmol/L (ref 101–111)
Creatinine, Ser: 0.76 mg/dL (ref 0.44–1.00)
GFR calc Af Amer: >60 mL/min (ref >60)
Glucose, Bld: 315 mg/dL — ABNORMAL HIGH (ref 65–99)
POTASSIUM: 4.1 mmol/L (ref 3.5–5.1)
SODIUM: 134 mmol/L — AB (ref 135–145)

## 2016-01-28 LAB — CHLAMYDIA/NGC RT PCR (ARMC ONLY)
CHLAMYDIA TR: NOT DETECTED
N gonorrhoeae: NOT DETECTED

## 2016-01-28 LAB — GLUCOSE, CAPILLARY: GLUCOSE-CAPILLARY: 288 mg/dL — AB (ref 65–99)

## 2016-01-28 MED ORDER — METRONIDAZOLE 500 MG PO TABS: 500.0000 mg | ORAL_TABLET | Freq: Two times a day (BID) | ORAL | 0 refills | Status: DC

## 2016-01-28 MED ORDER — FLUCONAZOLE 150 MG PO TABS: 150.0000 mg | ORAL_TABLET | Freq: Every day | ORAL | 1 refills | Status: DC

## 2016-01-28 MED ORDER — METFORMIN HCL 1000 MG PO TABS: 1000.0000 mg | ORAL_TABLET | Freq: Two times a day (BID) | ORAL | 11 refills | Status: DC

## 2016-01-28 NOTE — ED Provider Notes (Signed)
[facility] Emergency Department Provider Note        Time seen: ----------------------------------------- 2:37 PM on 01/28/2016 -----------------------------------------    I have reviewed the triage vital signs and the nursing notes.   HISTORY  Chief Complaint Vaginal Itching    HPI Chelsea Nash is a 40 y.o. female who presents to the ER for vaginal itching. Patient reports she is diabetic and unsure what her blood sugar is. Blood sugar on arrival was 288. Patient states she has not had recent illness, complains of a persistent vaginal itching that she believes is a yeast infection. Patient states she's been treated several times for it. Patient reports recent unprotected sexual activity. She denies other complaints.   Past Medical History:  Diagnosis Date  . Asthma   . Diabetes mellitus without complication (HCC)     There are no active problems to display for this patient.   Past Surgical History:  Procedure Laterality Date  . adhesion removal after c-sections    . CESAREAN SECTION     x2  . TUBAL LIGATION      Allergies Review of patient's allergies indicates no known allergies.  Social History Social History  Substance Use Topics  . Smoking status: Never Smoker  . Smokeless tobacco: Never Used  . Alcohol use No    Review of Systems Constitutional: Negative for fever. Cardiovascular: Negative for chest pain. Respiratory: Negative for shortness of breath. Gastrointestinal: Negative for abdominal pain, vomiting and diarrhea. Genitourinary: Negative for dysuria.Positive for vaginal itching Musculoskeletal: Negative for back pain. Skin: Negative for rash. Neurological: Negative for headaches, focal weakness or numbness.  10-point ROS otherwise negative.  ____________________________________________   PHYSICAL EXAM:  VITAL SIGNS: ED Triage Vitals  Enc Vitals Group     BP 01/28/16 1155 116/87     Pulse Rate 01/28/16  1155 85     Resp 01/28/16 1155 18     Temp 01/28/16 1155 98.5 F (36.9 C)     Temp Source 01/28/16 1155 Oral     SpO2 01/28/16 1155 98 %     Weight 01/28/16 1156 192 lb (87.1 kg)     Height 01/28/16 1156 4\' 11"  (1.499 m)     Head Circumference --      Peak Flow --      Pain Score --      Pain Loc --      Pain Edu? --      Excl. in GC? --     Constitutional: Alert and oriented. Well appearing and in no distress. Eyes: Conjunctivae are normal. PERRL. Normal extraocular movements. ENT   Head: Normocephalic and atraumatic.   Nose: No congestion/rhinnorhea.   Mouth/Throat: Mucous membranes are moist.   Neck: No stridor. Cardiovascular: Normal rate, regular rhythm. No murmurs, rubs, or gallops. Respiratory: Normal respiratory effort without tachypnea nor retractions. Breath sounds are clear and equal bilaterally. No wheezes/rales/rhonchi. Gastrointestinal: Soft and nontender. Normal bowel sounds Genitourinary: No bleeding,White vaginal discharge is noted, mild cervical motion tenderness Musculoskeletal: Nontender with normal range of motion in all extremities. No lower extremity tenderness nor edema. Neurologic:  Normal speech and language. No gross focal neurologic deficits are appreciated.  Skin:  Skin is warm, dry and intact. No rash noted. Psychiatric: Mood and affect are normal. Speech and behavior are normal.  ____________________________________________  ED COURSE:  Pertinent labs & imaging results that were available during my care of the patient were reviewed by me and considered in my medical decision making (see  chart for details). Clinical Course  Patient is in no distress, we will assess with basic labs and reevaluate.  Procedures ____________________________________________   LABS (pertinent positives/negatives)  Labs Reviewed  WET PREP, GENITAL - Abnormal; Notable for the following:       Result Value   Clue Cells Wet Prep HPF POC PRESENT (*)     WBC, Wet Prep HPF POC FEW (*)    All other components within normal limits  GLUCOSE, CAPILLARY - Abnormal; Notable for the following:    Glucose-Capillary 288 (*)    All other components within normal limits  BASIC METABOLIC PANEL - Abnormal; Notable for the following:    Sodium 134 (*)    Glucose, Bld 315 (*)    All other components within normal limits  URINALYSIS COMPLETEWITH MICROSCOPIC ([facility] ONLY) - Abnormal; Notable for the following:    Color, Urine YELLOW (*)    APPearance CLEAR (*)    Glucose, UA >500 (*)    Ketones, ur TRACE (*)    Squamous Epithelial / LPF 0-5 (*)    All other components within normal limits  CHLAMYDIA/NGC RT PCR ([facility] ONLY)  CBC WITH DIFFERENTIAL/PLATELET   ____________________________________________  FINAL ASSESSMENT AND PLAN  Bacterial vaginosis, hyperglycemia  Plan: Patient with labs as dictated above. Patient is no distress, she'll be started on Flagyl for bacterial vaginosis. She will also be given a one-time dose of Diflucan. She does need her metformin increased. She is stable for outpatient follow-up with her doctor.   Emily FilbertWilliams, Fredrick Dray E, MD   Note: This dictation was prepared with Dragon dictation. Any transcriptional errors that result from this process are unintentional    Emily FilbertJonathan E Curlie Macken, MD 01/28/16 1538

## 2016-01-28 NOTE — ED Triage Notes (Addendum)
Patient states she has been treated several times for a yeast infection but the medication doesn't seem to work. Patient states she is a diabetic and is unsure what her sugar is. Blood sugar is 288 in triage.

## 2016-06-17 ENCOUNTER — Emergency Department
Admission: EM | Admit: 2016-06-17 | Discharge: 2016-06-17 | Disposition: A | Discharge status: Home / Self Care | Payer: Medicaid Other | Attending: Emergency Medicine | Admitting: Emergency Medicine

## 2016-06-17 ENCOUNTER — Emergency Department: Payer: Medicaid Other

## 2016-06-17 ENCOUNTER — Encounter: Payer: Self-pay | Admitting: Emergency Medicine

## 2016-06-17 DIAGNOSIS — S39012A Strain of muscle, fascia and tendon of lower back, initial encounter: Secondary | ICD-10-CM | POA: Insufficient documentation

## 2016-06-17 DIAGNOSIS — Y92410 Unspecified street and highway as the place of occurrence of the external cause: Secondary | ICD-10-CM | POA: Diagnosis not present

## 2016-06-17 DIAGNOSIS — Y999 Unspecified external cause status: Secondary | ICD-10-CM | POA: Insufficient documentation

## 2016-06-17 DIAGNOSIS — E119 Type 2 diabetes mellitus without complications: Secondary | ICD-10-CM | POA: Diagnosis not present

## 2016-06-17 DIAGNOSIS — Z7984 Long term (current) use of oral hypoglycemic drugs: Secondary | ICD-10-CM | POA: Diagnosis not present

## 2016-06-17 DIAGNOSIS — S3992XA Unspecified injury of lower back, initial encounter: Secondary | ICD-10-CM | POA: Diagnosis present

## 2016-06-17 DIAGNOSIS — Z79899 Other long term (current) drug therapy: Secondary | ICD-10-CM | POA: Insufficient documentation

## 2016-06-17 DIAGNOSIS — J45909 Unspecified asthma, uncomplicated: Secondary | ICD-10-CM | POA: Insufficient documentation

## 2016-06-17 DIAGNOSIS — Y9389 Activity, other specified: Secondary | ICD-10-CM | POA: Insufficient documentation

## 2016-06-17 LAB — POCT PREGNANCY, URINE: PREG TEST UR: NEGATIVE

## 2016-06-17 MED ORDER — IBUPROFEN 800 MG PO TABS: 800.0000 mg | ORAL_TABLET | Freq: Three times a day (TID) | ORAL | 0 refills | Status: DC

## 2016-06-17 NOTE — ED Notes (Signed)

## 2016-06-17 NOTE — Discharge Instructions (Signed)
Follow-up with your primary care doctor if any continued problems. Ice or heat to your back as needed for inflammation or pain. Begin taking ibuprofen 800 mg 3 times a day with food.
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
## 2017-10-29 ENCOUNTER — Other Ambulatory Visit: Payer: Self-pay

## 2017-10-29 ENCOUNTER — Emergency Department
Admission: EM | Admit: 2017-10-29 | Discharge: 2017-10-29 | Disposition: A | Discharge status: Home / Self Care | Payer: No Typology Code available for payment source | Attending: Emergency Medicine | Admitting: Emergency Medicine

## 2017-10-29 DIAGNOSIS — E119 Type 2 diabetes mellitus without complications: Secondary | ICD-10-CM | POA: Diagnosis not present

## 2017-10-29 DIAGNOSIS — Z7984 Long term (current) use of oral hypoglycemic drugs: Secondary | ICD-10-CM | POA: Diagnosis not present

## 2017-10-29 DIAGNOSIS — Z79899 Other long term (current) drug therapy: Secondary | ICD-10-CM | POA: Diagnosis not present

## 2017-10-29 DIAGNOSIS — M7918 Myalgia, other site: Secondary | ICD-10-CM | POA: Diagnosis not present

## 2017-10-29 DIAGNOSIS — J45909 Unspecified asthma, uncomplicated: Secondary | ICD-10-CM | POA: Insufficient documentation

## 2017-10-29 DIAGNOSIS — M542 Cervicalgia: Secondary | ICD-10-CM | POA: Diagnosis present

## 2017-10-29 MED ORDER — NAPROXEN 500 MG PO TABS: 500.0000 mg | ORAL_TABLET | Freq: Two times a day (BID) | ORAL | 0 refills | Status: DC

## 2017-10-29 MED ORDER — CYCLOBENZAPRINE HCL 10 MG PO TABS: 10.0000 mg | ORAL_TABLET | Freq: Three times a day (TID) | ORAL | 0 refills | Status: DC | PRN
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
## 2018-03-09 ENCOUNTER — Other Ambulatory Visit: Payer: Self-pay | Admitting: Family Medicine

## 2018-03-09 DIAGNOSIS — Z1231 Encounter for screening mammogram for malignant neoplasm of breast: Secondary | ICD-10-CM
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
## 2021-09-09 DIAGNOSIS — R200 Anesthesia of skin: Secondary | ICD-10-CM | POA: Insufficient documentation

## 2022-05-23 ENCOUNTER — Other Ambulatory Visit: Payer: Self-pay

## 2022-05-23 ENCOUNTER — Encounter: Payer: Self-pay | Admitting: Emergency Medicine

## 2022-05-23 ENCOUNTER — Emergency Department: Payer: Medicaid Other

## 2022-05-23 ENCOUNTER — Emergency Department
Admission: EM | Admit: 2022-05-23 | Discharge: 2022-05-23 | Disposition: A | Discharge status: Home / Self Care | Payer: Medicaid Other | Attending: Emergency Medicine | Admitting: Emergency Medicine

## 2022-05-23 DIAGNOSIS — M25511 Pain in right shoulder: Secondary | ICD-10-CM | POA: Insufficient documentation

## 2022-05-23 DIAGNOSIS — J45909 Unspecified asthma, uncomplicated: Secondary | ICD-10-CM | POA: Diagnosis not present

## 2022-05-23 DIAGNOSIS — I10 Essential (primary) hypertension: Secondary | ICD-10-CM | POA: Insufficient documentation

## 2022-05-23 DIAGNOSIS — Y93E2 Activity, laundry: Secondary | ICD-10-CM | POA: Diagnosis not present

## 2022-05-23 DIAGNOSIS — X501XXA Overexertion from prolonged static or awkward postures, initial encounter: Secondary | ICD-10-CM | POA: Diagnosis not present

## 2022-05-23 DIAGNOSIS — S4991XA Unspecified injury of right shoulder and upper arm, initial encounter: Secondary | ICD-10-CM

## 2022-05-23 MED ORDER — TRAMADOL HCL 50 MG PO TABS: 50.0000 mg | ORAL_TABLET | Freq: Four times a day (QID) | ORAL | 0 refills | Status: DC | PRN

## 2022-05-23 NOTE — ED Triage Notes (Signed)
Pt via POV from home. Pt c/o R arm pain. States she picked up a basket with her R arm pain and heard a "crank" Pt c/o upper R arm pain and swelling. Pt is A&Ox4 and NAD. Ambulatory to triage.

## 2022-05-23 NOTE — ED Provider Notes (Signed)
   [facility] Provider Note    Event Date/Time   First MD Initiated Contact with Patient 05/23/22 1903     (approximate)  History   Chief Complaint: Arm Injury  HPI  Chelsea Nash is a 47 y.o. female with a past medical history of asthma, diabetes, presents to the emergency department for right shoulder pain.  According to the patient she was at the [employer] she states she lifted something with her right arm and felt a pop in the right shoulder and has had pain in the area ever since.  Patient has good range of motion in the shoulder but with pain elicited.  No ecchymosis noted on exam.  Physical Exam   Triage Vital Signs: ED Triage Vitals  Enc Vitals Group     BP 05/23/22 1815 (!) 144/97     Pulse Rate 05/23/22 1815 (!) 101     Resp 05/23/22 1815 18     Temp 05/23/22 1815 98.7 F (37.1 C)     Temp Source 05/23/22 1815 Oral     SpO2 05/23/22 1815 97 %     Weight 05/23/22 1814 164 lb (74.4 kg)     Height 05/23/22 1814 4' 11"$  (1.499 m)     Head Circumference --      Peak Flow --      Pain Score 05/23/22 1814 8     Pain Loc --      Pain Edu? --      Excl. in [place]? --     Most recent vital signs: Vitals:   05/23/22 1815  BP: (!) 144/97  Pulse: (!) 101  Resp: 18  Temp: 98.7 F (37.1 C)  SpO2: 97%    General: Awake, no distress.  CV:  Good peripheral perfusion.  Regular rate and rhythm  Resp:  Normal effort.  Equal breath sounds bilaterally.  Abd:  No distention Other:  Patient has moderate tenderness to palpation of the right shoulder as well as moderate pain elicited with range of motion.  Neurovascular intact distally with 2+ radial pulse.   ED Results / Procedures / Treatments   RADIOLOGY  I have reviewed and interpreted the x-ray images.  No obvious fracture seen on my evaluation. Radiology is read the x-ray as negative.   MEDICATIONS ORDERED IN ED: Medications - No data to display   IMPRESSION / MDM / [place] /  ED COURSE  I reviewed the triage vital signs and the nursing notes.  Patient's presentation is most consistent with acute illness / injury with system symptoms.  Patient presents emergency department for right shoulder pain after lifting something with her right arm.  Patient states she felt a pop in the shoulder and has been experiencing worsening pain ever since.  Patient has good range of motion in the shoulder but with pain elicited.  Tenderness to palpation as well.  Neurovascular intact.  Will obtain an x-ray as a precaution suspect more muscular injury.  X-rays negative.  Will place patient on short course of tramadol have the patient follow-up with her doctor.  FINAL CLINICAL IMPRESSION(S) / ED DIAGNOSES   Right shoulder pain    Note:  This document was prepared using Dragon voice recognition software and may include unintentional dictation errors.   Harvest Dark, MD 05/23/22 2006

## 2022-06-07 ENCOUNTER — Emergency Department
Admission: EM | Admit: 2022-06-07 | Discharge: 2022-06-07 | Disposition: A | Discharge status: Home / Self Care | Payer: Medicaid Other | Attending: Student in an Organized Health Care Education/Training Program | Admitting: Student in an Organized Health Care Education/Training Program

## 2022-06-07 ENCOUNTER — Other Ambulatory Visit: Payer: Self-pay

## 2022-06-07 ENCOUNTER — Encounter: Payer: Self-pay | Admitting: Emergency Medicine

## 2022-06-07 DIAGNOSIS — X500XXA Overexertion from strenuous movement or load, initial encounter: Secondary | ICD-10-CM | POA: Diagnosis not present

## 2022-06-07 DIAGNOSIS — Y990 Civilian activity done for income or pay: Secondary | ICD-10-CM | POA: Diagnosis not present

## 2022-06-07 DIAGNOSIS — J45909 Unspecified asthma, uncomplicated: Secondary | ICD-10-CM | POA: Insufficient documentation

## 2022-06-07 DIAGNOSIS — S46001A Unspecified injury of muscle(s) and tendon(s) of the rotator cuff of right shoulder, initial encounter: Secondary | ICD-10-CM | POA: Diagnosis not present

## 2022-06-07 DIAGNOSIS — E119 Type 2 diabetes mellitus without complications: Secondary | ICD-10-CM | POA: Diagnosis not present

## 2022-06-07 MED ORDER — MELOXICAM 15 MG PO TABS: 15.0000 mg | ORAL_TABLET | Freq: Every day | ORAL | 2 refills | Status: AC
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
## 2022-06-07 NOTE — Discharge Instructions (Signed)
Follow-up with your regular doctor as needed.  Follow-up with orthopedics to evaluate your rotator cuff.  Wear the sling.  Still take your arm out throughout the day and move the elbow and upper arm several times a day.  You do not have to sleep in the sling. No use of your right arm at work for 1 week. Take meloxicam as prescribed.  May also take Tylenol

## 2022-10-21 DIAGNOSIS — E1142 Type 2 diabetes mellitus with diabetic polyneuropathy: Secondary | ICD-10-CM | POA: Diagnosis not present

## 2022-10-21 DIAGNOSIS — E7800 Pure hypercholesterolemia, unspecified: Secondary | ICD-10-CM | POA: Diagnosis not present

## 2022-10-21 DIAGNOSIS — I10 Essential (primary) hypertension: Secondary | ICD-10-CM | POA: Diagnosis not present

## 2022-10-21 DIAGNOSIS — R5383 Other fatigue: Secondary | ICD-10-CM | POA: Diagnosis not present

## 2022-12-16 DIAGNOSIS — Z23 Encounter for immunization: Secondary | ICD-10-CM | POA: Diagnosis not present

## 2023-02-19 ENCOUNTER — Emergency Department
Admission: EM | Admit: 2023-02-19 | Discharge: 2023-02-19 | Disposition: A | Discharge status: Home / Self Care | Payer: 59 | Attending: Emergency Medicine | Admitting: Emergency Medicine

## 2023-02-19 ENCOUNTER — Other Ambulatory Visit: Payer: Self-pay

## 2023-02-19 DIAGNOSIS — J45909 Unspecified asthma, uncomplicated: Secondary | ICD-10-CM | POA: Insufficient documentation

## 2023-02-19 DIAGNOSIS — R002 Palpitations: Secondary | ICD-10-CM

## 2023-02-19 DIAGNOSIS — R000 Tachycardia, unspecified: Secondary | ICD-10-CM | POA: Diagnosis not present

## 2023-02-19 DIAGNOSIS — E119 Type 2 diabetes mellitus without complications: Secondary | ICD-10-CM | POA: Insufficient documentation

## 2023-02-19 LAB — BASIC METABOLIC PANEL
Anion gap: 11 (ref 5–15)
BUN: 11 mg/dL (ref 6–20)
CO2: 26 mmol/L (ref 22–32)
Calcium: 9.6 mg/dL (ref 8.9–10.3)
Chloride: 95 mmol/L — ABNORMAL LOW (ref 98–111)
Creatinine, Ser: 0.82 mg/dL (ref 0.44–1.00)
GFR, Estimated: >60 mL/min (ref >60)
Glucose, Bld: 363 mg/dL — ABNORMAL HIGH (ref 70–99)
Potassium: 4.3 mmol/L (ref 3.5–5.1)
Sodium: 132 mmol/L — ABNORMAL LOW (ref 135–145)

## 2023-02-19 LAB — CBC
HCT: 45.5 % (ref 36.0–46.0)
Hemoglobin: 15.7 g/dL — ABNORMAL HIGH (ref 12.0–15.0)
MCH: 31.4 pg (ref 26.0–34.0)
MCHC: 34.5 g/dL (ref 30.0–36.0)
MCV: 91 fL (ref 80.0–100.0)
Platelets: 293 10*3/uL (ref 150–400)
RBC: 5 MIL/uL (ref 3.87–5.11)
RDW: 12.2 % (ref 11.5–15.5)
WBC: 9.4 10*3/uL (ref 4.0–10.5)
nRBC: 0 % (ref 0.0–0.2)

## 2023-02-19 LAB — HCG, QUANTITATIVE, PREGNANCY: hCG, Beta Chain, Quant, S: 1 m[IU]/mL (ref <5)

## 2023-02-19 LAB — TROPONIN I (HIGH SENSITIVITY)
Troponin I (High Sensitivity): 5 ng/L (ref <18)
Troponin I (High Sensitivity): 7 ng/L (ref <18)

## 2023-02-19 LAB — D-DIMER, QUANTITATIVE: D-Dimer, Quant: <0.27 ug{FEU}/mL (ref 0.00–0.50)

## 2023-02-19 MED ORDER — SODIUM CHLORIDE 0.9 % IV BOLUS
1000.0000 mL | Freq: Once | INTRAVENOUS | Status: AC
  Administered 2023-02-19: 1000 mL via INTRAVENOUS

## 2023-02-19 MED ORDER — LORAZEPAM 2 MG/ML IJ SOLN
1.0000 mg | Freq: Once | INTRAMUSCULAR | Status: AC
  Administered 2023-02-19: 1 mg via INTRAVENOUS
  Filled 2023-02-19: qty 1

## 2023-02-19 NOTE — Discharge Instructions (Signed)
Please call the number provided for cardiology to arrange a follow-up appointment for further evaluation and consideration of a Holter monitor.  Return to the emergency department for any symptom personally concerning to yourself.

## 2023-02-19 NOTE — ED Provider Notes (Signed)
[facility] Provider Note    Event Date/Time   First MD Initiated Contact with Patient 02/19/23 1121     (approximate)  History   Chief Complaint: Palpitations  HPI  Chelsea Nash is a 47 y.o. female with a medical history of asthma, diabetes, presents to the emergency department for palpitations/heart racing.  According to the patient this morning she awoke feeling like her heart was beating out of her chest.  Patient denies any chest pain denies any shortness of breath no nausea or vomiting diaphoresis or fever.  Patient states she has had anxiety in the past but not recently.  Unknown last period.  No abdominal pain.  Physical Exam   Triage Vital Signs: ED Triage Vitals  Encounter Vitals Group     BP 02/19/23 1012 (!) 149/105     Systolic BP Percentile --      Diastolic BP Percentile --      Pulse Rate 02/19/23 1012 (!) 122     Resp 02/19/23 1012 17     Temp 02/19/23 1012 98.2 F (36.8 C)     Temp Source 02/19/23 1012 Oral     SpO2 02/19/23 1012 97 %     Weight 02/19/23 1013 163 lb 2.3 oz (74 kg)     Height 02/19/23 1013 4\' 11"  (1.499 m)     Head Circumference --      Peak Flow --      Pain Score 02/19/23 1013 0     Pain Loc --      Pain Education --      Exclude from Growth Chart --     Most recent vital signs: Vitals:   02/19/23 1125 02/19/23 1128  BP: (!) 155/98 (!) 155/58  Pulse: (!) 107 (!) 113  Resp: 15 16  Temp:    SpO2: 100% 100%    General: Awake, no distress.  Mildly anxious appearing. CV:  Good peripheral perfusion.  Regular rhythm rate around 100 beats per minute. Resp:  Normal effort.  Equal breath sounds bilaterally.  Abd:  No distention.  Soft, nontender.  No rebound or guarding. Other:  No lower extremity edema or tenderness to palpation.   ED Results / Procedures / Treatments   EKG  EKG viewed and interpreted by myself shows sinus tachycardia at 112 bpm with a narrow QRS, normal axis, normal intervals, no  concerning ST changes.   MEDICATIONS ORDERED IN ED: Medications  sodium chloride 0.9 % bolus 1,000 mL (has no administration in time range)  LORazepam (ATIVAN) injection 1 mg (has no administration in time range)     IMPRESSION / MDM / ASSESSMENT AND PLAN / ED COURSE  I reviewed the triage vital signs and the nursing notes.  Patient's presentation is most consistent with acute presentation with potential threat to life or bodily function.  Patient presents to the emergency department for palpitations/heart racing sensation since awakening this morning.  Overall the patient appears well, no distress.  Does appear somewhat anxious.  Patient's lab work is reassuring with a normal CBC, reassuring chemistry besides mild hyperglycemia, hCG is negative.  Troponin negative.  Given the patient's tachycardia and hyperglycemia we will dose IV fluids we will obtain a urine sample to rule out infection.  She is mildly anxious appearing we will dose a small dose of Ativan.  Given the tachycardia for no other known reason we will also obtain a D-dimer although the patient denies any chest pain shortness of breath or  pleuritic pain.  Patient's workup is overall reassuring.  Patient's D-dimer is negative.  Patient's troponin is negative x 2, pregnancy test negative.  Given the patient's reassuring workup I believe the patient would be safe for discharge home with cardiology follow-up for consideration of a Holter monitor.  Patient's current pulse rate is around 90 bpm.  We will discharge after she finishes her IV fluids.  FINAL CLINICAL IMPRESSION(S) / ED DIAGNOSES   Tachycardia    Note:  This document was prepared using Dragon voice recognition software and may include unintentional dictation errors.   Minna Antis, MD 02/19/23 1400

## 2023-02-19 NOTE — ED Triage Notes (Signed)
Pt states her heart feels like it's beating out of her chest and her BP is up. States it started this morning. In triage, HR 122, BP 149/105. No complaints of pain.

## 2023-04-07 DIAGNOSIS — Z1211 Encounter for screening for malignant neoplasm of colon: Secondary | ICD-10-CM | POA: Diagnosis not present

## 2023-05-05 ENCOUNTER — Emergency Department
Admission: EM | Admit: 2023-05-05 | Discharge: 2023-05-05 | Disposition: A | Discharge status: Home / Self Care | Payer: 59 | Attending: Emergency Medicine | Admitting: Emergency Medicine

## 2023-05-05 ENCOUNTER — Other Ambulatory Visit: Payer: Self-pay

## 2023-05-05 ENCOUNTER — Emergency Department: Payer: 59

## 2023-05-05 ENCOUNTER — Encounter: Payer: Self-pay | Admitting: Emergency Medicine

## 2023-05-05 DIAGNOSIS — M546 Pain in thoracic spine: Secondary | ICD-10-CM | POA: Diagnosis not present

## 2023-05-05 DIAGNOSIS — Y92410 Unspecified street and highway as the place of occurrence of the external cause: Secondary | ICD-10-CM | POA: Insufficient documentation

## 2023-05-05 DIAGNOSIS — M25512 Pain in left shoulder: Secondary | ICD-10-CM | POA: Insufficient documentation

## 2023-05-05 DIAGNOSIS — S20212A Contusion of left front wall of thorax, initial encounter: Secondary | ICD-10-CM

## 2023-05-05 LAB — CBG MONITORING, ED: Glucose-Capillary: 466 mg/dL — ABNORMAL HIGH (ref 70–99)

## 2023-05-05 MED ORDER — LIDOCAINE 5 % EX PTCH: 1.0000 | MEDICATED_PATCH | Freq: Two times a day (BID) | CUTANEOUS | 0 refills | Status: AC

## 2023-05-05 MED ORDER — ACETAMINOPHEN 500 MG PO TABS
1000.0000 mg | ORAL_TABLET | Freq: Once | ORAL | Status: AC
  Administered 2023-05-05: 1000 mg via ORAL
  Filled 2023-05-05: qty 2

## 2023-05-05 MED ORDER — KETOROLAC TROMETHAMINE 30 MG/ML IJ SOLN
30.0000 mg | Freq: Once | INTRAMUSCULAR | Status: AC
  Administered 2023-05-05: 30 mg via INTRAMUSCULAR
  Filled 2023-05-05: qty 1

## 2023-05-05 MED ORDER — IBUPROFEN 600 MG PO TABS: 600.0000 mg | ORAL_TABLET | Freq: Three times a day (TID) | ORAL | 0 refills | Status: AC | PRN

## 2023-05-05 MED ORDER — LIDOCAINE 5 % EX PTCH
1.0000 | MEDICATED_PATCH | CUTANEOUS | Status: DC
  Administered 2023-05-05: 1 via TRANSDERMAL
  Filled 2023-05-05: qty 1
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
## 2023-08-03 ENCOUNTER — Emergency Department
Admission: EM | Admit: 2023-08-03 | Discharge: 2023-08-03 | Disposition: A | Discharge status: Home / Self Care | Attending: Emergency Medicine | Admitting: Emergency Medicine

## 2023-08-03 ENCOUNTER — Other Ambulatory Visit: Payer: Self-pay

## 2023-08-03 ENCOUNTER — Emergency Department

## 2023-08-03 ENCOUNTER — Encounter: Payer: Self-pay | Admitting: *Deleted

## 2023-08-03 DIAGNOSIS — R002 Palpitations: Secondary | ICD-10-CM | POA: Insufficient documentation

## 2023-08-03 DIAGNOSIS — I10 Essential (primary) hypertension: Secondary | ICD-10-CM | POA: Diagnosis not present

## 2023-08-03 LAB — BASIC METABOLIC PANEL WITH GFR
Anion gap: 12 (ref 5–15)
BUN: 11 mg/dL (ref 6–20)
CO2: 23 mmol/L (ref 22–32)
Calcium: 9.6 mg/dL (ref 8.9–10.3)
Chloride: 100 mmol/L (ref 98–111)
Creatinine, Ser: 0.76 mg/dL (ref 0.44–1.00)
GFR, Estimated: >60 mL/min (ref >60)
Glucose, Bld: 336 mg/dL — ABNORMAL HIGH (ref 70–99)
Potassium: 3.8 mmol/L (ref 3.5–5.1)
Sodium: 135 mmol/L (ref 135–145)

## 2023-08-03 LAB — CBC
HCT: 46.2 % — ABNORMAL HIGH (ref 36.0–46.0)
Hemoglobin: 15.7 g/dL — ABNORMAL HIGH (ref 12.0–15.0)
MCH: 31.5 pg (ref 26.0–34.0)
MCHC: 34 g/dL (ref 30.0–36.0)
MCV: 92.6 fL (ref 80.0–100.0)
Platelets: 293 10*3/uL (ref 150–400)
RBC: 4.99 MIL/uL (ref 3.87–5.11)
RDW: 12.1 % (ref 11.5–15.5)
WBC: 10.7 10*3/uL — ABNORMAL HIGH (ref 4.0–10.5)
nRBC: 0 % (ref 0.0–0.2)

## 2023-08-03 LAB — POC URINE PREG, ED: Preg Test, Ur: NEGATIVE

## 2023-08-03 LAB — MAGNESIUM: Magnesium: 2 mg/dL (ref 1.7–2.4)

## 2023-08-03 LAB — TROPONIN I (HIGH SENSITIVITY): Troponin I (High Sensitivity): 5 ng/L (ref <18)

## 2023-08-03 LAB — T4, FREE: Free T4: 0.8 ng/dL (ref 0.61–1.12)

## 2023-08-03 LAB — TSH: TSH: 1.72 u[IU]/mL (ref 0.350–4.500)

## 2023-08-03 MED ORDER — LISINOPRIL 5 MG PO TABS: 5.0000 mg | ORAL_TABLET | Freq: Every day | ORAL | 2 refills | Status: DC

## 2023-08-03 NOTE — ED Triage Notes (Signed)
 Pt ambulatory to triage.  Pt reports palpitations that began this am.  no chest pain or sob.  Pt reports nausea.    pt alert  speech clear.

## 2023-08-03 NOTE — ED Provider Notes (Signed)
 [facility] Provider Note    Event Date/Time   First MD Initiated Contact with Patient 08/03/23 2146     (approximate)   History   Palpitations   HPI  Chelsea Nash is a 48 year old female presenting to the emergency department for evaluation of palpitations.  This morning patient had onset of palpitations.  No associated chest pain or shortness of breath.  Does report that these are now resolved.  Checked her blood pressure at the pharmacy and noted it was high with a systolic of 150 and they recommended presentation to the ER.  Does report that she previously was on lisinopril, but was unable to keep her primary care doctor due to her insurance, so she has been out of this for about a month.  Denies other complaints.     Physical Exam   Triage Vital Signs: ED Triage Vitals  Encounter Vitals Group     BP 08/03/23 1917 (!) 163/109     Systolic BP Percentile --      Diastolic BP Percentile --      Pulse Rate 08/03/23 1917 (!) 109     Resp 08/03/23 1917 18     Temp 08/03/23 1917 98.3 F (36.8 C)     Temp Source 08/03/23 1917 Oral     SpO2 08/03/23 1917 98 %     Weight 08/03/23 1917 165 lb (74.8 kg)     Height 08/03/23 1917 4\' 11"  (1.499 m)     Head Circumference --      Peak Flow --      Pain Score 08/03/23 1922 0     Pain Loc --      Pain Education --      Exclude from Growth Chart --     Most recent vital signs: Vitals:   08/03/23 1917 08/03/23 2209  BP: (!) 163/109 (!) 173/102  Pulse: (!) 109   Resp: 18   Temp: 98.3 F (36.8 C)   SpO2: 98%      General: Awake, interactive  CV:  Regular rate at the time of my initial evaluation, good peripheral perfusion.  Resp:  Unlabored respirations, lungs clear to auscultation Abd:  Nondistended Neuro:  Symmetric facial movement, fluid speech, moving extremity spontaneously and equally   ED Results / Procedures / Treatments   Labs (all labs ordered are listed, but only abnormal results  are displayed) Labs Reviewed  BASIC METABOLIC PANEL WITH GFR - Abnormal; Notable for the following components:      Result Value   Glucose, Bld 336 (*)    All other components within normal limits  CBC - Abnormal; Notable for the following components:   WBC 10.7 (*)    Hemoglobin 15.7 (*)    HCT 46.2 (*)    All other components within normal limits  MAGNESIUM  TSH  T4, FREE  POC URINE PREG, ED  TROPONIN I (HIGH SENSITIVITY)  TROPONIN I (HIGH SENSITIVITY)     EKG EKG independently reviewed interpreted by myself (ER attending) demonstrates:  EKG demonstrate sinus tachycardia at a rate of 102, PR 164, QRS 82, QTc 443, no acute ST changes  RADIOLOGY Imaging independently reviewed and interpreted by myself demonstrates:  CXR without focal consolidation Formal Radiology Read:  DG Chest 2 View Result Date: 08/03/2023 CLINICAL DATA:  Palpitations EXAM: CHEST - 2 VIEW COMPARISON:  Chest x-Nyanna Heideman 05/05/2023 FINDINGS: The heart size and mediastinal contours are within normal limits. Both lungs are clear. The visualized skeletal  structures are unremarkable. IMPRESSION: No active cardiopulmonary disease. Electronically Signed   By: Tyron Gallon M.D.   On: 08/03/2023 19:41    PROCEDURES:  Critical Care performed: No  Procedures   MEDICATIONS ORDERED IN ED: Medications - No data to display   IMPRESSION / MDM / ASSESSMENT AND PLAN / ED COURSE  I reviewed the triage vital signs and the nursing notes.  Differential diagnosis includes, but is not limited to, anemia, electrolyte abnormality, arrhythmia, dehydration, thyroid dysfunction  Patient's presentation is most consistent with acute presentation with potential threat to life or bodily function.  48 year old female presenting with palpitations.  Mild tachycardia on presentation, improved at the time of my initial evaluation.  Labs without critical derangement.  Does note hyperglycemia without evidence of DKA. Suspect component of  dehydration.  Patient reports that she has access to her diabetes medication but has not been taking these regularly.  No vomiting, will encourage increased p.o. intake.  Does have uncontrolled hypertension here, but no evidence of hypertensive emergency.  Will send prescription to restart patient on blood pressure medication.  Has arranged outpatient appointment to establish with primary care doctor.  Strict return precautions provided.  Patient discharged stable condition.    FINAL CLINICAL IMPRESSION(S) / ED DIAGNOSES   Final diagnoses:  Palpitations  Uncontrolled hypertension     Rx / DC Orders   ED Discharge Orders          Ordered    lisinopril (ZESTRIL) 5 MG tablet  Daily        08/03/23 2221    Ambulatory Referral to Primary Care (Establish Care)        08/03/23 2221             Note:  This document was prepared using Dragon voice recognition software and may include unintentional dictation errors.   Claria Crofts, MD 08/03/23 2224

## 2023-08-03 NOTE — Discharge Instructions (Signed)
 You were seen in the ER today for evaluation of your palpitations. Your exam, EKG, and labs were reassuring against an emergency cause for this.  I suspect that you are dehydrated.  Please make sure you take your medications as prescribed.  If sent a prescription for blood pressure medication to your pharmacy.  Please follow-up with a PCP for reevaluation. Return to the ER for worsening chest pain, difficulty breathing, or any other new or concerning symptoms.

## 2023-09-27 ENCOUNTER — Ambulatory Visit: Admitting: Family Medicine

## 2023-09-27 ENCOUNTER — Encounter: Payer: Self-pay | Admitting: Family Medicine

## 2023-09-27 VITALS — BP 107/73 | HR 106 | Ht 59.0 in | Wt 168.0 lb

## 2023-09-27 DIAGNOSIS — E1159 Type 2 diabetes mellitus with other circulatory complications: Secondary | ICD-10-CM | POA: Diagnosis not present

## 2023-09-27 DIAGNOSIS — I152 Hypertension secondary to endocrine disorders: Secondary | ICD-10-CM | POA: Insufficient documentation

## 2023-09-27 DIAGNOSIS — E1140 Type 2 diabetes mellitus with diabetic neuropathy, unspecified: Secondary | ICD-10-CM | POA: Insufficient documentation

## 2023-09-27 DIAGNOSIS — Z794 Long term (current) use of insulin: Secondary | ICD-10-CM | POA: Diagnosis not present

## 2023-09-27 DIAGNOSIS — E1169 Type 2 diabetes mellitus with other specified complication: Secondary | ICD-10-CM | POA: Diagnosis not present

## 2023-09-27 DIAGNOSIS — J4520 Mild intermittent asthma, uncomplicated: Secondary | ICD-10-CM | POA: Insufficient documentation

## 2023-09-27 DIAGNOSIS — G5793 Unspecified mononeuropathy of bilateral lower limbs: Secondary | ICD-10-CM | POA: Insufficient documentation

## 2023-09-27 DIAGNOSIS — E785 Hyperlipidemia, unspecified: Secondary | ICD-10-CM

## 2023-09-27 MED ORDER — PIOGLITAZONE HCL 45 MG PO TABS: 45.0000 mg | ORAL_TABLET | Freq: Every day | ORAL | 2 refills | Status: AC

## 2023-09-27 MED ORDER — DEXCOM G7 SENSOR MISC: 1.0000 | Freq: Every day | 6 refills | Status: AC

## 2023-09-27 MED ORDER — LISINOPRIL 5 MG PO TABS: 5.0000 mg | ORAL_TABLET | Freq: Every day | ORAL | 2 refills | Status: DC

## 2023-09-27 MED ORDER — METFORMIN HCL 1000 MG PO TABS: 1000.0000 mg | ORAL_TABLET | Freq: Two times a day (BID) | ORAL | 3 refills | Status: AC

## 2023-09-27 MED ORDER — NOVOLOG FLEXPEN 100 UNIT/ML ~~LOC~~ SOPN
10.0000 [IU] | PEN_INJECTOR | Freq: Three times a day (TID) | SUBCUTANEOUS | 2 refills | Status: AC
Start: 2023-09-27 — End: ?

## 2023-09-27 MED ORDER — ALBUTEROL SULFATE HFA 108 (90 BASE) MCG/ACT IN AERS: 2.0000 | INHALATION_SPRAY | RESPIRATORY_TRACT | 6 refills | Status: AC | PRN

## 2023-09-27 MED ORDER — ROSUVASTATIN CALCIUM 20 MG PO TABS: 20.0000 mg | ORAL_TABLET | Freq: Every day | ORAL | 2 refills | Status: AC

## 2023-09-27 MED ORDER — EMPAGLIFLOZIN 10 MG PO TABS: 10.0000 mg | ORAL_TABLET | Freq: Every day | ORAL | Status: AC

## 2023-09-27 MED ORDER — GABAPENTIN 300 MG PO CAPS: 300.0000 mg | ORAL_CAPSULE | Freq: Three times a day (TID) | ORAL | 3 refills | Status: DC

## 2023-09-27 MED ORDER — MONTELUKAST SODIUM 10 MG PO TABS: 10.0000 mg | ORAL_TABLET | Freq: Every day | ORAL | 2 refills | Status: AC

## 2023-09-27 NOTE — Assessment & Plan Note (Signed)
 Asthma Chronic  Stable, asymptomatic currently  Asthma managed with albuterol and Singulair. Reports minimal symptoms due to nocturnal work schedule. - Refill albuterol inhaler, two puffs as needed every 4-6 hours - Refill Singulair 10 mg daily

## 2023-09-27 NOTE — Assessment & Plan Note (Signed)
 Chronic Hypertension well controlled with lisinopril  5 mg daily. Blood pressure at the visit was 107/73 mmHg. - Refill lisinopril  5 mg daily

## 2023-09-27 NOTE — Assessment & Plan Note (Addendum)
 Chronic Diabetes with previously uncontrolled blood glucose levels, last HbA1c approximately 9%. Currently on Novolog insulin, metformin , Jardiance, and Actos. She has been without medications due to lack of a primary care provider. Blood pressure is well controlled at 107/73 mmHg. No diabetic retinopathy on recent eye exam. - Order HbA1c, metabolic panel, and urine albumin to assess diabetes control and kidney function - Refill Novolog insulin, metformin  1000 mg twice daily, Jardiance 10 mg daily, and Actos 45 mg daily - Prescribe Dexcom 7 for continuous glucose monitoring - urine albumin collected today  - completed DM foot exam today  - will need DM eye exam from [facility]

## 2023-09-27 NOTE — Assessment & Plan Note (Signed)
 Chronic  Hyperlipidemia managed with Crestor 20 mg daily. She expresses concern about cholesterol levels. - Order lipid panel to assess cholesterol levels - Refill Crestor 20 mg daily

## 2023-09-27 NOTE — Assessment & Plan Note (Signed)
 Chronic neuropathy with burning pain and numbness in both feet, particularly in the toes, with hyperpigmentation and sensitivity to touch. Managed with gabapentin 100 mg three times daily. Reports significant discomfort and functional limitations. - Increase gabapentin to 300 mg three times daily - Order CBC and B12 to evaluate neuropathy symptoms - Refer to neurology and podiatry for further evaluation and foot care

## 2023-09-27 NOTE — Progress Notes (Signed)
 New patient visit   Patient: Chelsea Nash   DOB: 1975/12/05   48 y.o. Female  MRN: [id number] Visit Date: 09/27/2023  Today's healthcare provider: Rockie Agent, MD   Chief Complaint  Patient presents with   Establish Care    Discuss possible referral to Neuro for neuropathy    Subjective    Chelsea Nash is a 48 y.o. female who presents today as a new patient to establish care.   HPI     Establish Care    Additional comments: Discuss possible referral to Neuro for neuropathy       Last edited by Thelbert Eulalio HERO, CMA on 09/27/2023  2:32 PM.       Discussed the use of AI scribe software for clinical note transcription with the patient, who gave verbal consent to proceed.  History of Present Illness Chelsea Nash is a 48 year old female with diabetes and neuropathy who presents to establish care as a new patient.  She has a history of diabetes, previously uncontrolled with an A1c of approximately 9. She is currently using Novolog insulin, taking about six units three times a day, but has had difficulty obtaining her medications due to lack of a doctor. She also takes metformin  1000 mg twice daily and Actos 45 mg daily. Her current medications include Jardiance, lisinopril  5 mg daily, Crestor 20 mg daily, and Singulair 10 mg daily. She has recently obtained new glasses and her diabetes eye exam showed no diabetic retinopathy.  She experiences neuropathy, primarily with burning pain and numbness in both feet, particularly in the toes. She describes her toes as feeling 'like they're not there' and has difficulty with nail care due to sensitivity. She also notes hyperpigmentation on her feet, describing it as 'dark brown' in color. Her neuropathy is managed with gabapentin 100 mg three times a day.  She has a history of high blood pressure and asthma. Her asthma is managed with albuterol as needed and Singulair 10 mg daily. Asthma is not frequently  bothersome as she sleeps during the day and works at night. Blood pressure is well controlled with lisinopril  5 mg daily.  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 She has a history of chronic constipation, currently managed with Amitiza 24 mcg daily, and is awaiting a colonoscopy.      Past Medical History:  Diagnosis Date   Asthma    Diabetes mellitus without complication (HCC)     Outpatient Medications Prior to Visit  Medication Sig   lubiprostone (AMITIZA) 24 MCG capsule Take 24 mcg by mouth.   [DISCONTINUED] albuterol (VENTOLIN HFA) 108 (90 Base) MCG/ACT inhaler Inhale 2 puffs into the lungs.   [DISCONTINUED] insulin aspart (NOVOLOG FLEXPEN) 100 UNIT/ML FlexPen Inject 4-16 Units into the skin.   [DISCONTINUED] montelukast (SINGULAIR) 10 MG tablet Take 10 mg by mouth.   [DISCONTINUED] pioglitazone (ACTOS) 45 MG tablet Take 45 mg by mouth.   [DISCONTINUED] rosuvastatin (CRESTOR) 20 MG tablet Take 20 mg by mouth.   [DISCONTINUED] Empagliflozin (JARDIANCE PO) Take by mouth every morning.   [DISCONTINUED] gabapentin (NEURONTIN) 100 MG capsule Take 100 mg by mouth.   [DISCONTINUED] lisinopril  (ZESTRIL ) 5 MG tablet Take 1 tablet (5 mg total) by mouth daily.   [DISCONTINUED] metFORMIN  (GLUCOPHAGE ) 1000 MG tablet Take 1 tablet (1,000 mg total) by mouth 2 (two) times daily with a meal.   [DISCONTINUED] traMADol  (ULTRAM ) 50 MG tablet Take 1 tablet (  50 mg total) by mouth every 6 (six) hours as needed.   No facility-administered medications prior to visit.    Past Surgical History:  Procedure Laterality Date   adhesion removal after c-sections     CESAREAN SECTION     x2   TUBAL LIGATION     Family Status  Relation Name Status   Mother  Alive   Father  Alive  No partnership data on file   History reviewed. No pertinent family history. Social History   Socioeconomic History   Marital status: Single    Spouse name: Not on  file   Number of children: Not on file   Years of education: Not on file   Highest education level: Not on file  Occupational History   Not on file  Tobacco Use   Smoking status: Never   Smokeless tobacco: Never  Substance and Sexual Activity   Alcohol use: Yes   Drug use: No   Sexual activity: Yes    Comment: Does not use condoms  Other Topics Concern   Not on file  Social History Narrative   Not on file   Social Drivers of Health   Financial Resource Strain: Low Risk  (07/19/2023)   Received from [facility]   Overall Financial Resource Strain (CARDIA)    Difficulty of Paying Living Expenses: Not very hard  Food Insecurity: No Food Insecurity (07/19/2023)   Received from [facility]   Hunger Vital Sign    Within the past 12 months, you worried that your food would run out before you got the money to buy more.: Never true    Within the past 12 months, the food you bought just didn't last and you didn't have money to get more.: Never true  Transportation Needs: No Transportation Needs (07/19/2023)   Received from [facility] - Transportation    In the past 12 months, has lack of transportation kept you from medical appointments or from getting medications?: No    Lack of Transportation (Non-Medical): No  Physical Activity: Not on file  Stress: Stress Concern Present (09/27/2023)   [employer] [facility] [employer] - Occupational Stress Questionnaire    Feeling of Stress: Rather much  Social Connections: Not on file     No Known Allergies   There is no immunization history on file for this patient.  Health Maintenance  Topic Date Due   HEMOGLOBIN A1C  Never done   OPHTHALMOLOGY EXAM  Never done   HIV Screening  Never done   Diabetic kidney evaluation - Urine ACR  Never done   Hepatitis C Screening  Never done   DTaP/Tdap/Td (1 - Tdap) Never done   Hepatitis B Vaccines (1 of 3 - 19+ 3-dose series)  Never done   Cervical Cancer Screening (HPV/Pap Cotest)  Never done   Colonoscopy  Never done   COVID-19 Vaccine (1 - 2024-25 season) Never done   INFLUENZA VACCINE  10/28/2023   Diabetic kidney evaluation - eGFR measurement  08/02/2024   FOOT EXAM  09/26/2024   HPV VACCINES  Aged Out   Meningococcal B Vaccine  Aged Out    Patient Care Team: Sharma Coyer, MD as PCP - General (Family Medicine) [place], Teodoro K, MD as Consulting Physician (Gastroenterology)  Review of Systems  Last CBC Lab Results  Component Value Date   WBC 10.7 (H) 08/03/2023   HGB 15.7 (H) 08/03/2023   HCT 46.2 (H) 08/03/2023  MCV 92.6 08/03/2023   MCH 31.5 08/03/2023   RDW 12.1 08/03/2023   PLT 293 08/03/2023   Last metabolic panel Lab Results  Component Value Date   GLUCOSE 336 (H) 08/03/2023   NA 135 08/03/2023   K 3.8 08/03/2023   CL 100 08/03/2023   CO2 23 08/03/2023   BUN 11 08/03/2023   CREATININE 0.76 08/03/2023   GFRNONAA >60 08/03/2023   CALCIUM 9.6 08/03/2023   PROT 8.0 06/27/2013   ALBUMIN 3.5 06/27/2013   BILITOT 0.5 06/27/2013   ALKPHOS 81 06/27/2013   AST 14 (L) 06/27/2013   ALT 18 06/27/2013   ANIONGAP 12 08/03/2023   Last lipids No results found for: CHOL, HDL, LDLCALC, LDLDIRECT, TRIG, CHOLHDL Last hemoglobin A1c No results found for: HGBA1C Last thyroid  functions Lab Results  Component Value Date   TSH 1.720 08/03/2023   Last vitamin D No results found for: 25OHVITD2, 25OHVITD3, VD25OH      Objective    BP 107/73   Pulse (!) 106   Ht 4' 11 (1.499 m)   Wt 168 lb (76.2 kg)   SpO2 99%   BMI 33.93 kg/m  BP Readings from Last 3 Encounters:  09/27/23 107/73  08/03/23 (!) 173/102  05/05/23 (!) 138/99   Wt Readings from Last 3 Encounters:  09/27/23 168 lb (76.2 kg)  08/03/23 165 lb (74.8 kg)  05/05/23 162 lb (73.5 kg)        Depression Screen    09/27/2023    2:41 PM  PHQ 2/9 Scores  PHQ - 2 Score 2  PHQ- 9 Score 9    No results found for any visits on 09/27/23.   Physical Exam General: Alert, no acute distress Cardio: Normal S1 and S2, RRR, no r/m/g Pulm: CTAB, normal work of breathing ABD: soft, abdomen is not distended, there is no tenderness to palpation, normal BS  Extremities: bilateral pedal edema      Assessment & Plan      Problem List Items Addressed This Visit       Cardiovascular and Mediastinum   Hypertension associated with diabetes (HCC)   Chronic Hypertension well controlled with lisinopril  5 mg daily. Blood pressure at the visit was 107/73 mmHg. - Refill lisinopril  5 mg daily      Relevant Medications   empagliflozin (JARDIANCE) 10 MG TABS tablet   rosuvastatin (CRESTOR) 20 MG tablet   pioglitazone (ACTOS) 45 MG tablet   metFORMIN  (GLUCOPHAGE ) 1000 MG tablet   insulin aspart (NOVOLOG FLEXPEN) 100 UNIT/ML FlexPen   lisinopril  (ZESTRIL ) 5 MG tablet   Other Relevant Orders   CMP14+EGFR     Respiratory   Mild intermittent asthma without complication   Asthma Chronic  Stable, asymptomatic currently  Asthma managed with albuterol and Singulair. Reports minimal symptoms due to nocturnal work schedule. - Refill albuterol inhaler, two puffs as needed every 4-6 hours - Refill Singulair 10 mg daily      Relevant Medications   montelukast (SINGULAIR) 10 MG tablet   albuterol (VENTOLIN HFA) 108 (90 Base) MCG/ACT inhaler     Endocrine   Type 2 diabetes mellitus with diabetic neuropathy, with long-term current use of insulin (HCC) - Primary   Chronic Diabetes with previously uncontrolled blood glucose levels, last HbA1c approximately 9%. Currently on Novolog insulin, metformin , Jardiance, and Actos. She has been without medications due to lack of a primary care provider. Blood pressure is well controlled at 107/73 mmHg. No diabetic retinopathy on recent eye exam. - Order HbA1c,  metabolic panel, and urine albumin to assess diabetes control and kidney function - Refill  Novolog insulin, metformin  1000 mg twice daily, Jardiance 10 mg daily, and Actos 45 mg daily - Prescribe Dexcom 7 for continuous glucose monitoring - urine albumin collected today  - completed DM foot exam today  - will need DM eye exam from [facility]       Relevant Medications   empagliflozin (JARDIANCE) 10 MG TABS tablet   rosuvastatin (CRESTOR) 20 MG tablet   pioglitazone (ACTOS) 45 MG tablet   metFORMIN  (GLUCOPHAGE ) 1000 MG tablet   gabapentin (NEURONTIN) 300 MG capsule   insulin aspart (NOVOLOG FLEXPEN) 100 UNIT/ML FlexPen   Continuous Glucose Sensor (DEXCOM G7 SENSOR) MISC   lisinopril  (ZESTRIL ) 5 MG tablet   Other Relevant Orders   Urine Albumin/Creatinine with ratio (send out) [LAB689]   HM Diabetes Foot Exam (Completed)   Hemoglobin A1c   Vitamin B12   CBC   Ambulatory referral to Podiatry   Hyperlipidemia associated with type 2 diabetes mellitus (HCC)   Chronic  Hyperlipidemia managed with Crestor 20 mg daily. She expresses concern about cholesterol levels. - Order lipid panel to assess cholesterol levels - Refill Crestor 20 mg daily      Relevant Medications   empagliflozin (JARDIANCE) 10 MG TABS tablet   rosuvastatin (CRESTOR) 20 MG tablet   pioglitazone (ACTOS) 45 MG tablet   metFORMIN  (GLUCOPHAGE ) 1000 MG tablet   insulin aspart (NOVOLOG FLEXPEN) 100 UNIT/ML FlexPen   lisinopril  (ZESTRIL ) 5 MG tablet   Other Relevant Orders   Lipid panel     Nervous and Auditory   Neuropathy of both feet   Chronic neuropathy with burning pain and numbness in both feet, particularly in the toes, with hyperpigmentation and sensitivity to touch. Managed with gabapentin 100 mg three times daily. Reports significant discomfort and functional limitations. - Increase gabapentin to 300 mg three times daily - Order CBC and B12 to evaluate neuropathy symptoms - Refer to neurology and podiatry for further evaluation and foot care      Relevant Medications   gabapentin  (NEURONTIN) 300 MG capsule     Assessment & Plan   Chronic Constipation Chronic constipation managed with Amitiza 24 mcg daily. Awaiting clearance for a colonoscopy. - previously on Amitiza 24 mcg daily, will have her discuss continuing this agent with her GI doc, no refills provided today   General Health Maintenance Up to date with eye exams and recently received new glasses. Plans to schedule a Pap smear at the next visit. - Schedule Pap smear at later date   Follow-up She has been without medications due to lack of a primary care provider. Follow-up is necessary to adjust diabetes management and ensure medication adherence. - Schedule follow-up appointment in 6 weeks to review lab results and adjust diabetes medications as needed      Return in about 6 weeks (around 11/08/2023) for DM, CHRONIC F/U.      Rockie Agent, MD  [facility] [phone] (phone) [phone] (fax)  [facility]

## 2023-09-28 ENCOUNTER — Ambulatory Visit: Payer: Self-pay | Admitting: Family Medicine

## 2023-09-28 LAB — CMP14+EGFR
ALT: 20 IU/L (ref 0–32)
AST: 18 IU/L (ref 0–40)
Albumin: 3.9 g/dL (ref 3.9–4.9)
Alkaline Phosphatase: 93 IU/L (ref 44–121)
BUN/Creatinine Ratio: 12 (ref 9–23)
BUN: 10 mg/dL (ref 6–24)
Bilirubin Total: 0.4 mg/dL (ref 0.0–1.2)
CO2: 20 mmol/L (ref 20–29)
Calcium: 9.6 mg/dL (ref 8.7–10.2)
Chloride: 100 mmol/L (ref 96–106)
Creatinine, Ser: 0.81 mg/dL (ref 0.57–1.00)
Globulin, Total: 2.9 g/dL (ref 1.5–4.5)
Glucose: 309 mg/dL — ABNORMAL HIGH (ref 70–99)
Potassium: 4.4 mmol/L (ref 3.5–5.2)
Sodium: 137 mmol/L (ref 134–144)
Total Protein: 6.8 g/dL (ref 6.0–8.5)
eGFR: 91 mL/min/{1.73_m2} (ref >59)

## 2023-09-28 LAB — LIPID PANEL
Chol/HDL Ratio: 4.3 ratio (ref 0.0–4.4)
Cholesterol, Total: 260 mg/dL — ABNORMAL HIGH (ref 100–199)
HDL: 61 mg/dL (ref >39)
LDL Chol Calc (NIH): 167 mg/dL — ABNORMAL HIGH (ref 0–99)
Triglycerides: 175 mg/dL — ABNORMAL HIGH (ref 0–149)
VLDL Cholesterol Cal: 32 mg/dL (ref 5–40)

## 2023-09-28 LAB — MICROALBUMIN / CREATININE URINE RATIO
Creatinine, Urine: 31.8 mg/dL
Microalb/Creat Ratio: <9 mg/g{creat} (ref 0–29)
Microalbumin, Urine: <3 ug/mL

## 2023-09-28 LAB — CBC
Hematocrit: 43 % (ref 34.0–46.6)
Hemoglobin: 14.5 g/dL (ref 11.1–15.9)
MCH: 32.3 pg (ref 26.6–33.0)
MCHC: 33.7 g/dL (ref 31.5–35.7)
MCV: 96 fL (ref 79–97)
Platelets: 277 10*3/uL (ref 150–450)
RBC: 4.49 x10E6/uL (ref 3.77–5.28)
RDW: 12.1 % (ref 11.7–15.4)
WBC: 10.4 10*3/uL (ref 3.4–10.8)

## 2023-09-28 LAB — VITAMIN B12: Vitamin B-12: 327 pg/mL (ref 232–1245)

## 2023-09-28 LAB — HEMOGLOBIN A1C
Est. average glucose Bld gHb Est-mCnc: 329 mg/dL
Hgb A1c MFr Bld: 13.1 % — ABNORMAL HIGH (ref 4.8–5.6)

## 2023-10-11 ENCOUNTER — Ambulatory Visit: Payer: Self-pay | Admitting: Podiatry

## 2023-10-11 DIAGNOSIS — M216X2 Other acquired deformities of left foot: Secondary | ICD-10-CM

## 2023-10-11 DIAGNOSIS — M216X1 Other acquired deformities of right foot: Secondary | ICD-10-CM | POA: Diagnosis not present

## 2023-10-11 DIAGNOSIS — G63 Polyneuropathy in diseases classified elsewhere: Secondary | ICD-10-CM | POA: Diagnosis not present

## 2023-10-11 NOTE — Progress Notes (Signed)
 Subjective:  Patient ID: Chelsea Nash, female    DOB: Jun 09, 1975,  MRN: [id number]  Chief Complaint  Patient presents with   Diabetes    Pt stated that she is a diabetic and she is experiencing a lot of numbness tingling and burning in her feet     48 y.o. female presents with the above complaint.  Patient presents with a lot of burning burning numbness tingling sensation secondary to diabetes.  She states been present for quite some time is progressive gotten worse.  She is taking gabapentin  which has not helped.  She wanted to get it evaluated.  She states that her A1c has been uncontrolled with last 1 being at 13%.  Review of Systems: Negative except as noted in the HPI. Denies N/V/F/Ch.  Past Medical History:  Diagnosis Date   Asthma    Diabetes mellitus without complication (HCC)     Current Outpatient Medications:    albuterol  (VENTOLIN  HFA) 108 (90 Base) MCG/ACT inhaler, Inhale 2 puffs into the lungs every 4 (four) hours as needed for wheezing or shortness of breath., Disp: 8 g, Rfl: 6   Continuous Glucose Sensor (DEXCOM G7 SENSOR) MISC, 1 Device by Does not apply route daily., Disp: 4 each, Rfl: 6   empagliflozin  (JARDIANCE ) 10 MG TABS tablet, Take 1 tablet (10 mg total) by mouth daily., Disp: 90 tablet, Rfl: 01   gabapentin  (NEURONTIN ) 300 MG capsule, Take 1 capsule (300 mg total) by mouth 3 (three) times daily., Disp: 90 capsule, Rfl: 3   insulin aspart  (NOVOLOG  FLEXPEN) 100 UNIT/ML FlexPen, Inject 10 Units into the skin 3 (three) times daily with meals., Disp: 15 mL, Rfl: 2   lisinopril  (ZESTRIL ) 5 MG tablet, Take 1 tablet (5 mg total) by mouth daily., Disp: 30 tablet, Rfl: 2   lubiprostone (AMITIZA) 24 MCG capsule, Take 24 mcg by mouth., Disp: , Rfl:    metFORMIN  (GLUCOPHAGE ) 1000 MG tablet, Take 1 tablet (1,000 mg total) by mouth 2 (two) times daily with a meal., Disp: 180 tablet, Rfl: 3   montelukast  (SINGULAIR ) 10 MG tablet, Take 1 tablet (10 mg total) by mouth at  bedtime., Disp: 90 tablet, Rfl: 2   pioglitazone  (ACTOS ) 45 MG tablet, Take 1 tablet (45 mg total) by mouth daily., Disp: 90 tablet, Rfl: 2   rosuvastatin  (CRESTOR ) 20 MG tablet, Take 1 tablet (20 mg total) by mouth daily., Disp: 90 tablet, Rfl: 2  Social History   Tobacco Use  Smoking Status Never  Smokeless Tobacco Never    No Known Allergies Objective:  There were no vitals filed for this visit. There is no height or weight on file to calculate BMI. Constitutional Well developed. Well nourished.  Vascular Dorsalis pedis pulses palpable bilaterally. Posterior tibial pulses palpable bilaterally. Capillary refill normal to all digits.  No cyanosis or clubbing noted. Pedal hair growth normal.  Neurologic Normal speech. Oriented to person, place, and time. Decreased protective sensation and light touch grossly diminished bilaterally.  Negative tarsal tunnel syndrome common peroneal nerve syndrome.  Dermatologic Nails well groomed and normal in appearance. No open wounds. No skin lesions.  Orthopedic: Manual muscle strength 5 out of 5.  No deformities noted.  Mild hammertoe contractures noted.   Radiographs: None Assessment:   1. Polyneuropathy associated with underlying disease (HCC)    Plan:  Patient was evaluated and treated and all questions answered.  Bilateral peripheral neuropathy secondary to diabetes - All questions and concerns were discussed with the patient extensive due to  given the presence of diabetes she would benefit from referral to Dr. Marcelino for advance treatment of diabetes. - Sugar modification discussed - Referral placed for Dr. Marcelino  No follow-ups on file.

## 2023-10-24 ENCOUNTER — Ambulatory Visit: Admitting: Family Medicine

## 2023-10-24 NOTE — Progress Notes (Deleted)
 Established patient visit   Patient: Chelsea Nash   DOB: 12/22/1975   48 y.o. Female  MRN: [id number] Visit Date: 10/24/2023  Today's healthcare provider: Rockie Agent, MD   No chief complaint on file.  Subjective       Discussed the use of AI scribe software for clinical note transcription with the patient, who gave verbal consent to proceed.  History of Present Illness      Past Medical History:  Diagnosis Date   Asthma    Diabetes mellitus without complication (HCC)     Medications: Outpatient Medications Prior to Visit  Medication Sig   albuterol  (VENTOLIN  HFA) 108 (90 Base) MCG/ACT inhaler Inhale 2 puffs into the lungs every 4 (four) hours as needed for wheezing or shortness of breath.   Continuous Glucose Sensor (DEXCOM G7 SENSOR) MISC 1 Device by Does not apply route daily.   empagliflozin  (JARDIANCE ) 10 MG TABS tablet Take 1 tablet (10 mg total) by mouth daily.   gabapentin  (NEURONTIN ) 300 MG capsule Take 1 capsule (300 mg total) by mouth 3 (three) times daily.   insulin aspart  (NOVOLOG  FLEXPEN) 100 UNIT/ML FlexPen Inject 10 Units into the skin 3 (three) times daily with meals.   lisinopril  (ZESTRIL ) 5 MG tablet Take 1 tablet (5 mg total) by mouth daily.   lubiprostone (AMITIZA) 24 MCG capsule Take 24 mcg by mouth.   metFORMIN  (GLUCOPHAGE ) 1000 MG tablet Take 1 tablet (1,000 mg total) by mouth 2 (two) times daily with a meal.   montelukast  (SINGULAIR ) 10 MG tablet Take 1 tablet (10 mg total) by mouth at bedtime.   pioglitazone  (ACTOS ) 45 MG tablet Take 1 tablet (45 mg total) by mouth daily.   rosuvastatin  (CRESTOR ) 20 MG tablet Take 1 tablet (20 mg total) by mouth daily.   No facility-administered medications prior to visit.    Review of Systems  Last metabolic panel Lab Results  Component Value Date   GLUCOSE 309 (H) 09/27/2023   NA 137 09/27/2023   K 4.4 09/27/2023   CL 100 09/27/2023   CO2 20 09/27/2023   BUN 10 09/27/2023    CREATININE 0.81 09/27/2023   EGFR 91 09/27/2023   CALCIUM  9.6 09/27/2023   PROT 6.8 09/27/2023   ALBUMIN 3.9 09/27/2023   LABGLOB 2.9 09/27/2023   BILITOT 0.4 09/27/2023   ALKPHOS 93 09/27/2023   AST 18 09/27/2023   ALT 20 09/27/2023   ANIONGAP 12 08/03/2023   Last lipids Lab Results  Component Value Date   CHOL 260 (H) 09/27/2023   HDL 61 09/27/2023   LDLCALC 167 (H) 09/27/2023   TRIG 175 (H) 09/27/2023   CHOLHDL 4.3 09/27/2023   Last hemoglobin A1c Lab Results  Component Value Date   HGBA1C 13.1 (H) 09/27/2023     {See past labs  Heme  Chem  Endocrine  Serology  Results Review (optional):1}   Objective    There were no vitals taken for this visit. BP Readings from Last 3 Encounters:  09/27/23 107/73  08/03/23 (!) 173/102  05/05/23 (!) 138/99   Wt Readings from Last 3 Encounters:  09/27/23 168 lb (76.2 kg)  08/03/23 165 lb (74.8 kg)  05/05/23 162 lb (73.5 kg)    {See vitals history (optional):1}    Physical Exam  ***  No results found for any visits on 10/24/23.  Assessment & Plan     Problem List Items Addressed This Visit       Cardiovascular and Mediastinum  Hypertension associated with diabetes (HCC)     Endocrine   Type 2 diabetes mellitus with diabetic neuropathy, with long-term current use of insulin (HCC) - Primary    Assessment & Plan      No follow-ups on file.         Rockie Agent, MD  [facility] [phone] (phone) [phone] (fax)  [facility]

## 2023-11-04 ENCOUNTER — Ambulatory Visit: Admitting: Family Medicine

## 2023-11-10 ENCOUNTER — Ambulatory Visit
Attending: Student in an Organized Health Care Education/Training Program | Admitting: Student in an Organized Health Care Education/Training Program

## 2023-11-10 ENCOUNTER — Encounter: Payer: Self-pay | Admitting: Student in an Organized Health Care Education/Training Program

## 2023-11-10 VITALS — BP 142/94 | HR 103 | Temp 97.4°F | Resp 16 | Ht 59.0 in | Wt 163.0 lb

## 2023-11-10 DIAGNOSIS — Z7984 Long term (current) use of oral hypoglycemic drugs: Secondary | ICD-10-CM | POA: Diagnosis not present

## 2023-11-10 DIAGNOSIS — R202 Paresthesia of skin: Secondary | ICD-10-CM | POA: Diagnosis not present

## 2023-11-10 DIAGNOSIS — G894 Chronic pain syndrome: Secondary | ICD-10-CM | POA: Insufficient documentation

## 2023-11-10 DIAGNOSIS — E1140 Type 2 diabetes mellitus with diabetic neuropathy, unspecified: Secondary | ICD-10-CM | POA: Diagnosis not present

## 2023-11-10 MED ORDER — GABAPENTIN 600 MG PO TABS: 600.0000 mg | ORAL_TABLET | Freq: Three times a day (TID) | ORAL | 2 refills | Status: AC

## 2023-11-10 MED ORDER — GABAPENTIN 400 MG PO CAPS: 400.0000 mg | ORAL_CAPSULE | Freq: Three times a day (TID) | ORAL | 0 refills | Status: DC

## 2023-11-10 NOTE — Progress Notes (Signed)
 Safety precautions to be maintained throughout the outpatient stay will include: orient to surroundings, keep bed in low position, maintain call bell within reach at all times, provide assistance with transfer out of bed and ambulation.
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
## 2023-12-27 ENCOUNTER — Other Ambulatory Visit (HOSPITAL_COMMUNITY): Payer: Self-pay

## 2023-12-27 ENCOUNTER — Telehealth: Payer: Self-pay

## 2023-12-27 NOTE — Telephone Encounter (Signed)
 Pharmacy Patient Advocate Encounter   Received notification from Onbase that prior authorization for JARDIANCE  10MG  is required/requested.   Insurance verification completed.   The patient is insured through [employer].   Per test claim: PA required; PA submitted to above mentioned insurance via Latent Key/confirmation #/EOC [id number] Status is pending

## 2023-12-28 ENCOUNTER — Other Ambulatory Visit (HOSPITAL_COMMUNITY): Payer: Self-pay

## 2023-12-28 NOTE — Telephone Encounter (Signed)
 Pharmacy Patient Advocate Encounter  Received notification from [employer] that Prior Authorization for Jardiance  10MG  tablets  has been APPROVED from 12/13/2023 to 12/26/2024. Unable to obtain price due to refill too soon rejection, last fill date 12/26/2024/90 day supply next available fill date12/08/2023.   PA #/Case ID/Reference #: [id number]

## 2023-12-29 ENCOUNTER — Ambulatory Visit: Payer: Self-pay

## 2023-12-29 NOTE — Telephone Encounter (Signed)
  FYI Only or Action Required?: Action required by provider: clinical question for provider, update on patient condition, and patient requesting medication.  Patient was last seen in primary care on 09/27/2023 by Sharma Coyer, MD.  Called Nurse Triage reporting Vaginal Itching.  Symptoms began 4 days ago.  Interventions attempted: Nothing.  Symptoms are: gradually worsening.  Triage Disposition: See PCP When Office is Open (Within 3 Days)  Patient/caregiver understands and will follow disposition?: No, wishes to speak with PCP          Oil suspension if called in Patient states that she does not want pills       Copied from CRM [id number]. Topic: Clinical - Red Word Triage >> Dec 29, 2023  2:41 PM Wess RAMAN wrote: Red Word that prompted transfer to Nurse Triage: Patient states she has BV.  Symptoms: Itching  Pharmacy:  [facility] [address], [place] - [address] [place] [address] Phone: [phone] Fax: [phone] Hours: Not open 24 hours Reason for Disposition  [1] Vaginal itching AND [2] not improved > 3 days following Care Advice  Answer Assessment - Initial Assessment Questions Patient states that she has BV, states she has a history of it, and this feels exactly the same She doesn't want to come in for an appointment and would like a prescription sent to: [facility] [address], [place] - [address] [place] [address] Phone: [phone] Fax: [phone] Hours: Not open 24 hours  She also states that if Metronidazole  is called in, she would like the oil suspension---she states she did not want to take pill form--she said she forgets to take pills  Patient is advised to call us  back if anything changes Patient is advised that if anything worsens to go to Urgent Care or the Emergency Room. Patient verbalized understanding.    1. SYMPTOM: What's the main symptom you're concerned about?  (e.g., pain, itching, dryness)     itching 2. LOCATION: Where is the  itching located? (e.g., inside/outside, left/right)     vagina 3. ONSET: When did the  itching  start?     4 days ago 4. PAIN: Is there any pain? If Yes, ask: How bad is it? (Scale: 1-10; mild, moderate, severe)     Patient states a little burning but mostly itching 5. ITCHING: Is there any itching? If Yes, ask: How bad is it? (Scale: 1-10; mild, moderate, severe)     yes 6. CAUSE: What do you think is causing the discharge? Have you had the same problem before? What happened then?     ----- 7. OTHER SYMPTOMS: Do you have any other symptoms? (e.g., fever, itching, vaginal bleeding, pain with urination, injury to genital area, vaginal foreign body)     States a little burning but mostly itching 8. PREGNANCY: Is there any chance you are pregnant? When was your last menstrual period?     No  Protocols used: Vaginal Symptoms-A-AH

## 2023-12-30 MED ORDER — METRONIDAZOLE 50 MG/ML ORAL SUSPENSION: 15.0000 mg/kg/d | Freq: Three times a day (TID) | ORAL | 0 refills | Status: AC

## 2023-12-30 NOTE — Telephone Encounter (Signed)
 Rx sent.

## 2024-01-13 ENCOUNTER — Telehealth: Payer: Self-pay

## 2024-01-13 ENCOUNTER — Other Ambulatory Visit (HOSPITAL_COMMUNITY): Payer: Self-pay

## 2024-01-13 NOTE — Telephone Encounter (Signed)
 Pharmacy Patient Advocate Encounter   Received notification from Onbase that prior authorization for Dexcom G7 Sensor is required/requested.   Insurance verification completed.   The patient is insured through [employer].   Per test claim: PA required; PA submitted to above mentioned insurance via Latent Key/confirmation #/EOC [id number] Status is pending

## 2024-01-13 NOTE — Telephone Encounter (Signed)
 Pharmacy Patient Advocate Encounter  Received notification from [employer] that Prior Authorization for Dexcom G7 Sensor has been APPROVED from 01/13/2024 to 07/11/2024   PA #/Case ID/Reference #: [id number]

## 2024-01-16 ENCOUNTER — Other Ambulatory Visit (HOSPITAL_COMMUNITY): Payer: Self-pay

## 2024-03-01 ENCOUNTER — Ambulatory Visit

## 2024-03-01 DIAGNOSIS — M25612 Stiffness of left shoulder, not elsewhere classified: Secondary | ICD-10-CM | POA: Insufficient documentation

## 2024-03-01 DIAGNOSIS — M25611 Stiffness of right shoulder, not elsewhere classified: Secondary | ICD-10-CM | POA: Insufficient documentation

## 2024-03-01 DIAGNOSIS — M25512 Pain in left shoulder: Secondary | ICD-10-CM | POA: Diagnosis present

## 2024-03-01 DIAGNOSIS — M25511 Pain in right shoulder: Secondary | ICD-10-CM | POA: Diagnosis present

## 2024-03-01 NOTE — Therapy (Signed)
 OUTPATIENT PHYSICAL THERAPY CERVICAL EVALUATION   Patient Name: Chelsea Nash MRN: [id number] DOB:[birth year], 48 y.o., female Today's Date: 03/01/2024  END OF SESSION:  PT End of Session - 03/01/24 1425     Visit Number 1    Number of Visits 17    Date for Recertification  04/27/24    PT Start Time 1426    PT Stop Time 1515    PT Time Calculation (min) 49 min    Activity Tolerance Patient tolerated treatment well    Behavior During Therapy WFL for tasks assessed/performed          Past Medical History:  Diagnosis Date   Asthma    Diabetes mellitus without complication (HCC)    Past Surgical History:  Procedure Laterality Date   adhesion removal after c-sections     CESAREAN SECTION     x2   TUBAL LIGATION     Patient Active Problem List   Diagnosis Date Noted   Type 2 diabetes mellitus with diabetic neuropathy, with long-term current use of insulin (HCC) 09/27/2023   Hypertension associated with diabetes (HCC) 09/27/2023   Hyperlipidemia associated with type 2 diabetes mellitus (HCC) 09/27/2023   Mild intermittent asthma without complication 09/27/2023   Neuropathy of both feet 09/27/2023   Numbness of foot 09/09/2021    PCP: Sharma Coyer, MD   REFERRING PROVIDER: Kathlynn Sharper, MD  REFERRING DIAG: M75.01,M75.02 (ICD-10-CM) - Bilateral adhesive capsulitis of shoulders  THERAPY DIAG:  Right shoulder pain, unspecified chronicity - Plan: PT plan of care cert/re-cert  Left shoulder pain, unspecified chronicity - Plan: PT plan of care cert/re-cert  Stiffness of left shoulder, not elsewhere classified - Plan: PT plan of care cert/re-cert  Stiffness of right shoulder, not elsewhere classified - Plan: PT plan of care cert/re-cert  Rationale for Evaluation and Treatment: Rehabilitation  ONSET DATE: November 2025  SUBJECTIVE:                                                                                                                                                                                                          SUBJECTIVE STATEMENT: R anterior shoulder and arm: 8/10 at most for the past 3 months  L anterior shoulder and arm: 9/10 at most for the past 3 months    Hand dominance: Right  PERTINENT HISTORY:  B shoulder pain. Both shoulder hurt equally. Pain began the beginning of November 2025, gradual onset. Unknown mechanism of injury. Pt also adds that she lifts her grandchildren (2 and 1 year olds who are heavy for her).  R started hurting more than the left initially but now both shoulder bother her equally. Pain has worsened since onset. Dr. 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     Blood pressure is controlled per pt.  No latex allergies.    PAIN:  Are you having pain? Yes: NPRS scale: 8/10 currently Pain location: B anterior arms, just below the shoulder joint.  Pain description: sore Aggravating factors: reaching up, behind her back, behind her head, laying on her R and L sides. Relieving factors: using other hand to help the hurting arm down when she raises it up too high.    PRECAUTIONS: no known precautions  RED FLAGS: Cervical red flags: Dysphagia No, Dysmetria No, Diplopia No, Nystagmus No, and Nausea No, Bowel or bladder incontinence: No, and Cauda equina syndrome: No     WEIGHT BEARING RESTRICTIONS: No  FALLS:  Has patient fallen in last 6 months? No  LIVING ENVIRONMENT: Lives with: lives with their daughter Lives in: House/apartment Stairs: No Has following equipment at home: None  OCCUPATION: Part time with home health.   PLOF: Independent  PATIENT GOALS: Make shoulders feel better.   NEXT MD VISIT: none scheduled yet  OBJECTIVE:  Note: Objective measures were completed at Evaluation unless otherwise noted.  DIAGNOSTIC FINDINGS:    PATIENT SURVEYS:  UEFS  Extreme difficulty/unable (0), Quite a bit of difficulty (1), Moderate difficulty (2), Little  difficulty (3), No difficulty (4) Survey date:  03/01/2024  Any of your usual work, household or school activities 1  2. Your usual hobbies, recreational/sport activities 1   3. Lifting a bag of groceries to waist level 0   4. Lifting a bag of groceries above your head 0  5. Grooming your hair 1  6. Pushing up on your hands (I.e. from bathtub or chair) 1  7. Preparing food (I.e. peeling/cutting) 3  8. Driving  3  9. Vacuuming, sweeping, or raking 2  10. Dressing  3  11. Doing up buttons 3  12. Using tools/appliances 0  13. Opening doors 2  14. Cleaning  2  15. Tying or lacing shoes 0  16. Sleeping  0  17. Laundering clothes (I.e. washing, ironing, folding) 3  18. Opening a jar 3  19. Throwing a ball 0  20. Carrying a small suitcase with your affected limb.  0  Score total:  28/80     COGNITION: Overall cognitive status: Within functional limits for tasks assessed  SENSATION: Tinging at tips of L 3-4 digits of L hand.  Tingling R and L anterior arm   POSTURE: slightly forward neck, R shoulder lower, R scapular protraction compared to L  PALPATION: TTP R and L subacromial area (anterior and lateral), R and L anterior arm  Muscle tension B upper trap, B cervical paraspinal muscles  L shoulder joint: decreased inferior and posterior joint mobility  Feels good with L posterior shoulder joint mobilization   R shoulder joint: decreased inferior and posterior joint mobility       CERVICAL ROM:   Active ROM A/PROM (deg) eval  Flexion Full with B posterior shoulder pulling  Extension WFL with B anterior lateral neck pain  Right lateral flexion WFL with L lateral neck pull  Left lateral flexion WFL  Right rotation [facility] with R inferior lateral neck pain  Left rotation WFL with L inferior lateral neck pain   (Blank rows = not tested)  UPPER EXTREMITY ROM:  Active ROM Right eval Left  eval  Shoulder flexion 76 (93 AAROM with stiff end feel, anterior shoulder pain)  56 (60 AAROM, empty to stiff end feel, L lateral proximal arm pain)  Shoulder extension    Shoulder abduction 78 (86 AAROM, stiff and painful end feel) 40 (50 degrees AAROM with stiff and painful end feel); L shoulder shrug and R trunk side bend compensation  Shoulder adduction    Shoulder extension    Shoulder internal rotation 55 stiff and painful end feel (At 32 deg abduction)   88, stiff end feel  Shoulder external rotation 82 stiff and painful end feel At 32 deg abduction)  74 (stiff and painful end feel)  Elbow flexion    Elbow extension    Wrist flexion    Wrist extension    Wrist ulnar deviation    Wrist radial deviation    Wrist pronation    Wrist supination     (Blank rows = not tested)  UPPER EXTREMITY MMT:  MMT Right eval Left eval  Shoulder flexion 4 4  Shoulder extension    Shoulder abduction 4 4-  Shoulder adduction    Shoulder extension    Shoulder internal rotation 4- 4-  Shoulder external rotation 4- 4  Middle trapezius    Lower trapezius (seated manually resisted)  4- 3+  Elbow flexion    Elbow extension    Wrist flexion    Wrist extension    Wrist ulnar deviation    Wrist radial deviation    Wrist pronation    Wrist supination    Grip strength     (Blank rows = not tested)  CERVICAL SPECIAL TESTS:    FUNCTIONAL TESTS:    TREATMENT DATE: 03/01/2024                                                                                                                               Manual therapy    Supine with L arm in slight abduction   Posterior and inferior glide L humeral head grade 3- to 3+ to promote mobility, decrease stiffness  L shoulder feels better per pt.      PATIENT EDUCATION:  Education details: POC Person educated: Patient Education method: Explanation Education comprehension: verbalized understanding  HOME EXERCISE PROGRAM:   ASSESSMENT:  CLINICAL IMPRESSION: Patient is a 48 y.o. female who was seen today for  physical therapy evaluation and treatment for B shoulder pain. She also presents with altered posture, altered glenohumeral mechanics, posterior and inferior glenohumeral joint stiffness, B scapular and ER muscle weakness, TTP, and difficulty performing tasks which involve reaching as well as raising her arms secondary to pain. Pt will benefit from skilled physical therapy services to address the aforementioned deficits.      OBJECTIVE IMPAIRMENTS: decreased ROM, decreased strength, impaired UE functional use, improper body mechanics, postural dysfunction, and pain.   ACTIVITY LIMITATIONS: carrying, lifting, sleeping, bed mobility, bathing, toileting, dressing, reach over head, and hygiene/grooming  PARTICIPATION  LIMITATIONS:   PERSONAL FACTORS: Fitness, Profession, and 1 comorbidity: DM are also affecting patient's functional outcome.   REHAB POTENTIAL: Fair    CLINICAL DECISION MAKING: Stable/uncomplicated  EVALUATION COMPLEXITY: Low   GOALS: Goals reviewed with patient? Yes  SHORT TERM GOALS: Target date: 03/16/2024  Pt will be independent with her initial HEP to decrease pain, improve ROM, strength, function, and ability to reach and lay on her sides more comfortably.  Baseline: Pt has not yet started her initial HEP (03/01/2024) Goal status: INITIAL     LONG TERM GOALS: Target date: 04/27/2024  Pt will have a decrease in B shoulder pain to 3/10 or less at worst to promote ability to reach, as well as lay on her R and L sides for sleep more comfortably.  Baseline: R anterior shoulder and arm 8/10, L anterior shoulder and arm 9/10 at most for the past 3 months (03/01/2024) Goal status: INITIAL  2.  Pt will improve B shoulder flexion and abduction AROM by at least 30 degrees to promote ability to reach more comfortably.  Baseline:  Active ROM Right eval Left eval  Shoulder flexion 76 (93 AAROM with stiff end feel, anterior shoulder pain) 56 (60 AAROM, empty to stiff end feel,  L lateral proximal arm pain)  Shoulder abduction 78 (86 AAROM, stiff and painful end feel) 40 (50 degrees AAROM with stiff and painful end feel); L shoulder shrug and R trunk side bend compensation   Goal status: INITIAL  3.  Pt will improve her R shoulder ER and R and L shoulder IR AROM (at at least 75 degrees abduction) by at least 10 degrees to promote ability to reach behind her back and head more comfortably.  Baseline:  Active ROM Right eval Left eval  Shoulder internal rotation 55 stiff and painful end feel (At 32 deg abduction)   88, stiff end feel  Shoulder external rotation 82 stiff and painful end feel At 32 deg abduction)  74 (stiff and painful end feel)   Goal status: INITIAL  4.  Pt will improve her UEFI score by at least 10 points as a demonstration of improved function.  Baseline: 28/80 (03/01/2024) Goal status: INITIAL    PLAN:  PT FREQUENCY: 1-2x/week  PT DURATION: 8 weeks  PLANNED INTERVENTIONS: 97110-Therapeutic exercises, 97530- Therapeutic activity, 97112- Neuromuscular re-education, 97535- Self Care, 02859- Manual therapy, [id number]- Electrical stimulation (unattended), [phone]- Ionotophoresis 4mg /ml Dexamethasone, 79439 (1-2 muscles), 20561 (3+ muscles)- Dry Needling, Patient/Family education, Joint mobilization, and Spinal mobilization  PLAN FOR NEXT SESSION: Manual techniques such as joint mobilization; thoracic extension, scapular and ER muscle strengthening, modalities PRN   Derotha Fishbaugh, PT, DPT 03/01/2024, 4:55 PM

## 2024-03-08 ENCOUNTER — Ambulatory Visit

## 2024-03-08 ENCOUNTER — Telehealth: Payer: Self-pay

## 2024-03-08 NOTE — Telephone Encounter (Signed)
 No show. Called patient and left a message pertaining to appointment and a reminder for the next follow up session. Return phone call requested. Phone number [phone]) provided.

## 2024-03-12 ENCOUNTER — Other Ambulatory Visit (HOSPITAL_COMMUNITY)
Admission: RE | Admit: 2024-03-12 | Discharge: 2024-03-12 | Disposition: A | Source: Ambulatory Visit | Attending: Family Medicine | Admitting: Family Medicine

## 2024-03-12 ENCOUNTER — Encounter: Payer: Self-pay | Admitting: Family Medicine

## 2024-03-12 ENCOUNTER — Ambulatory Visit: Admitting: Family Medicine

## 2024-03-12 VITALS — BP 132/90 | HR 98 | Ht 59.0 in | Wt 159.0 lb

## 2024-03-12 DIAGNOSIS — Z794 Long term (current) use of insulin: Secondary | ICD-10-CM

## 2024-03-12 DIAGNOSIS — G5793 Unspecified mononeuropathy of bilateral lower limbs: Secondary | ICD-10-CM | POA: Diagnosis not present

## 2024-03-12 DIAGNOSIS — Z1211 Encounter for screening for malignant neoplasm of colon: Secondary | ICD-10-CM

## 2024-03-12 DIAGNOSIS — F321 Major depressive disorder, single episode, moderate: Secondary | ICD-10-CM

## 2024-03-12 DIAGNOSIS — N898 Other specified noninflammatory disorders of vagina: Secondary | ICD-10-CM

## 2024-03-12 DIAGNOSIS — B3731 Acute candidiasis of vulva and vagina: Secondary | ICD-10-CM

## 2024-03-12 DIAGNOSIS — E785 Hyperlipidemia, unspecified: Secondary | ICD-10-CM | POA: Diagnosis not present

## 2024-03-12 DIAGNOSIS — E1169 Type 2 diabetes mellitus with other specified complication: Secondary | ICD-10-CM

## 2024-03-12 DIAGNOSIS — E1140 Type 2 diabetes mellitus with diabetic neuropathy, unspecified: Secondary | ICD-10-CM

## 2024-03-12 DIAGNOSIS — I152 Hypertension secondary to endocrine disorders: Secondary | ICD-10-CM | POA: Diagnosis not present

## 2024-03-12 DIAGNOSIS — F5101 Primary insomnia: Secondary | ICD-10-CM | POA: Insufficient documentation

## 2024-03-12 DIAGNOSIS — J4520 Mild intermittent asthma, uncomplicated: Secondary | ICD-10-CM | POA: Diagnosis not present

## 2024-03-12 DIAGNOSIS — Z1231 Encounter for screening mammogram for malignant neoplasm of breast: Secondary | ICD-10-CM

## 2024-03-12 DIAGNOSIS — E1159 Type 2 diabetes mellitus with other circulatory complications: Secondary | ICD-10-CM

## 2024-03-12 MED ORDER — LISINOPRIL 10 MG PO TABS: 10.0000 mg | ORAL_TABLET | Freq: Every day | ORAL | 1 refills | Status: AC

## 2024-03-12 MED ORDER — FLUCONAZOLE 40 MG/ML PO SUSR: 200.0000 mg | Freq: Every day | ORAL | 0 refills | Status: AC

## 2024-03-12 MED ORDER — TRAZODONE HCL 50 MG PO TABS: 50.0000 mg | ORAL_TABLET | Freq: Every day | ORAL | 3 refills | Status: AC

## 2024-03-12 MED ORDER — LISINOPRIL 10 MG PO TABS: 10.0000 mg | ORAL_TABLET | Freq: Every day | ORAL | 1 refills | Status: DC

## 2024-03-12 MED ORDER — NOVOLOG FLEXPEN 100 UNIT/ML ~~LOC~~ SOPN: 10.0000 [IU] | PEN_INJECTOR | Freq: Three times a day (TID) | SUBCUTANEOUS | 2 refills | Status: AC

## 2024-03-12 NOTE — Assessment & Plan Note (Signed)
 Chronic hypertension with current blood pressure readings of 133/97 and 132/90, indicating suboptimal control. Currently on lisinopril  5 mg daily. - Increased lisinopril  to 10 mg daily

## 2024-03-12 NOTE — Assessment & Plan Note (Signed)
 Type 2 diabetes mellitus complicated by diabetic neuropathy and hyperlipidemia Chronic type 2 diabetes mellitus with poor glycemic control, last A1c was 13 five months ago. Diabetic neuropathy managed with gabapentin . Hyperlipidemia with LDL of 167 and triglycerides of 175. Current medications include metformin , Jardiance , Actos , and Novolog . Issues with obtaining Novolog  due to pharmacy concerns about Medicaid coverage. Sleep disturbances may affect diabetes management. - Ordered A1c test - Continue metformin  1000 mg twice daily - Continue Jardiance  - Continue Actos  - Continue Novolog  10 units three times daily - Increased lisinopril  to 10 mg daily - Ordered cholesterol panel - Scheduled diabetes eye exam - Discussed potential increase in Jardiance  dosage based on A1c results

## 2024-03-12 NOTE — Assessment & Plan Note (Signed)
 Chronic  Hyperlipidemia managed with Crestor 20 mg daily. She expresses concern about cholesterol levels. - Order lipid panel to assess cholesterol levels - Refill Crestor 20 mg daily

## 2024-03-12 NOTE — Assessment & Plan Note (Signed)
°  Mild intermittent asthma Chronic  Well-controlled with no recent use of inhaler reported. -continue PRN albuterol

## 2024-03-12 NOTE — Assessment & Plan Note (Signed)
 Acute Prescribed diflucan  200mg  daily for 5 days  Vaginal swab for BV and yeast ordered

## 2024-03-12 NOTE — Patient Instructions (Signed)
 [facility] [address] [place],  [place]  [address] Main: [phone]    To keep you healthy, please keep in mind the following health maintenance items that you are due for:   Health Maintenance Due  Topic Date Due   OPHTHALMOLOGY EXAM  Never done   HIV Screening  Never done   Hepatitis C Screening  Never done   Pneumococcal Vaccine (1 of 2 - PCV) Never done   Hepatitis B Vaccines 19-59 Average Risk (1 of 3 - 19+ 3-dose series) Never done   Cervical Cancer Screening (HPV/Pap Cotest)  Never done   Mammogram  Never done   Colonoscopy  Never done   DTaP/Tdap/Td (2 - Td or Tdap) 03/10/2023   Influenza Vaccine  10/28/2023   COVID-19 Vaccine (1 - 2025-26 season) Never done     Best Wishes,   Dr. Lang

## 2024-03-12 NOTE — Assessment & Plan Note (Signed)
 Chronic Follows with orthopedics  Continue gabapentin  600mg  TID  DC gabapentin  400 mg TID

## 2024-03-12 NOTE — Assessment & Plan Note (Signed)
 Primary insomnia Chronic  Reports difficulty sleeping, averaging three hours per night. No new stressors identified. - Prescribed trazodone  25-50 mg, half to full tablet as needed for sleep

## 2024-03-12 NOTE — Progress Notes (Signed)
 Established patient visit   Patient: Chelsea Nash   DOB: Aug 10, 1975   48 y.o. Female  MRN: [id number] Visit Date: 03/12/2024  Today's healthcare provider: Rockie Agent, MD   Chief Complaint  Patient presents with   Diabetes Management Plan    Patient is present for f/u DM, doing well. Has seen pain mgmt for frozen shoulder bilateral    Vaginal Itching    Onset x 2 months, believes this may be due to A1c being high    Subjective     HPI     Diabetes Management Plan    Additional comments: Patient is present for f/u DM, doing well. Has seen pain mgmt for frozen shoulder bilateral         Vaginal Itching    Additional comments: Onset x 2 months, believes this may be due to A1c being high       Last edited by Cherry Chiquita HERO, CMA on 03/12/2024  2:40 PM.       Discussed the use of AI scribe software for clinical note transcription with the patient, who gave verbal consent to proceed.  History of Present Illness Chelsea Nash is a 48 year old female who presents for management of diabetes and chronic hypertension and reports vaginal itching.  She has a history of diabetes, chronic hypertension, hyperlipidemia, and mild intermittent asthma. She is currently on metformin  1000 mg twice daily and uses a Dexcom G7 sensor for diabetes monitoring. Her last A1c was 13% five months ago. She has had difficulty obtaining Novolog  due to insurance issues, leading to a lapse in her medication for the past three days. She is also on Jardiance  and Actos  for diabetes management. She experiences poor sleep, averaging about three hours per night, which she believes affects her diabetes management.  She has chronic hypertension and is currently taking lisinopril  5 mg daily.  She experiences chronic hyperlipidemia and is on Crestor  20 mg daily. Her last cholesterol panel showed an LDL of 167 mg/dL and triglycerides of 8924 mg/dL.  She experiences chronic neuropathy and  is currently taking gabapentin  600 mg three times daily. She previously took 400 mg three times daily but has since increased the dosage.  She reports vaginal itching for the past two months, which she associates with her diabetes management. She describes the discharge as 'white, cloudy' and sometimes 'gray clouds' but denies any fishy smell. She has been on Jardiance  for more than two months.  She has mild intermittent asthma and uses albuterol  as needed. She reports that she has not needed to use her inhaler recently.  She has chronic constipation and is on lubiprostone for management.  She reports an elevated PHQ-9 score of 10 today but is not currently on any prescription medication for depression.  She has not had a mammogram in seven years and has not had a Pap smear recently. She is awaiting a colonoscopy appointment for colon cancer screening. No recent stress or changes in her living situation.     Past Medical History:  Diagnosis Date   Asthma    Diabetes mellitus without complication (HCC)     Medications: Show/hide medication list[1]  Review of Systems  Last metabolic panel Lab Results  Component Value Date   GLUCOSE 309 (H) 09/27/2023   NA 137 09/27/2023   K 4.4 09/27/2023   CL 100 09/27/2023   CO2 20 09/27/2023   BUN 10 09/27/2023   CREATININE 0.81 09/27/2023   EGFR 91  09/27/2023   CALCIUM  9.6 09/27/2023   PROT 6.8 09/27/2023   ALBUMIN 3.9 09/27/2023   LABGLOB 2.9 09/27/2023   BILITOT 0.4 09/27/2023   ALKPHOS 93 09/27/2023   AST 18 09/27/2023   ALT 20 09/27/2023   ANIONGAP 12 08/03/2023   Last lipids Lab Results  Component Value Date   CHOL 260 (H) 09/27/2023   HDL 61 09/27/2023   LDLCALC 167 (H) 09/27/2023   TRIG 175 (H) 09/27/2023   CHOLHDL 4.3 09/27/2023  The 10-year ASCVD risk score (Arnett DK, et al., 2019) is: 7.9%  Last hemoglobin A1c Lab Results  Component Value Date   HGBA1C 13.1 (H) 09/27/2023        Objective    BP (!) 132/90  (Cuff Size: Normal)   Pulse 98   Ht 4' 11 (1.499 m)   Wt 159 lb (72.1 kg)   LMP  (LMP Unknown) Comment: Unsure of last period date  SpO2 100%   BMI 32.11 kg/m   BP Readings from Last 3 Encounters:  03/12/24 (!) 132/90  11/10/23 (!) 142/94  09/27/23 107/73   Wt Readings from Last 3 Encounters:  03/12/24 159 lb (72.1 kg)  11/10/23 163 lb (73.9 kg)  09/27/23 168 lb (76.2 kg)        Physical Exam Vitals reviewed.  Constitutional:      General: She is not in acute distress.    Appearance: Normal appearance. She is not ill-appearing.  Cardiovascular:     Rate and Rhythm: Normal rate and regular rhythm.  Pulmonary:     Effort: Pulmonary effort is normal. No respiratory distress.     Breath sounds: No wheezing, rhonchi or rales.  Neurological:     Mental Status: She is alert and oriented to person, place, and time.  Psychiatric:        Mood and Affect: Mood normal.        Behavior: Behavior normal.       No results found for any visits on 03/12/24.  Assessment & Plan     Problem List Items Addressed This Visit     Depression, major, single episode, moderate (HCC)   Relevant Medications   traZODone  (DESYREL ) 50 MG tablet   Hyperlipidemia associated with type 2 diabetes mellitus (HCC)   Chronic  Hyperlipidemia managed with Crestor  20 mg daily. She expresses concern about cholesterol levels. - Order lipid panel to assess cholesterol levels - Refill Crestor  20 mg daily      Relevant Medications   insulin aspart  (NOVOLOG  FLEXPEN) 100 UNIT/ML FlexPen   lisinopril  (ZESTRIL ) 10 MG tablet   Other Relevant Orders   Lipid panel   Hypertension associated with diabetes (HCC)   Chronic hypertension with current blood pressure readings of 133/97 and 132/90, indicating suboptimal control. Currently on lisinopril  5 mg daily. - Increased lisinopril  to 10 mg daily      Relevant Medications   insulin aspart  (NOVOLOG  FLEXPEN) 100 UNIT/ML FlexPen   lisinopril  (ZESTRIL ) 10 MG  tablet   Mild intermittent asthma without complication    Mild intermittent asthma Chronic  Well-controlled with no recent use of inhaler reported. -continue PRN albuterol       Neuropathy of both feet   Chronic Follows with orthopedics  Continue gabapentin  600mg  TID  DC gabapentin  400 mg TID       Relevant Medications   traZODone  (DESYREL ) 50 MG tablet   Primary insomnia   Primary insomnia Chronic  Reports difficulty sleeping, averaging three hours per night. No new stressors  identified. - Prescribed trazodone  25-50 mg, half to full tablet as needed for sleep      Relevant Medications   traZODone  (DESYREL ) 50 MG tablet   Type 2 diabetes mellitus with diabetic neuropathy, with long-term current use of insulin (HCC) - Primary   Type 2 diabetes mellitus complicated by diabetic neuropathy and hyperlipidemia Chronic type 2 diabetes mellitus with poor glycemic control, last A1c was 13 five months ago. Diabetic neuropathy managed with gabapentin . Hyperlipidemia with LDL of 167 and triglycerides of 175. Current medications include metformin , Jardiance , Actos , and Novolog . Issues with obtaining Novolog  due to pharmacy concerns about Medicaid coverage. Sleep disturbances may affect diabetes management. - Ordered A1c test - Continue metformin  1000 mg twice daily - Continue Jardiance  - Continue Actos  - Continue Novolog  10 units three times daily - Increased lisinopril  to 10 mg daily - Ordered cholesterol panel - Scheduled diabetes eye exam - Discussed potential increase in Jardiance  dosage based on A1c results      Relevant Medications   insulin aspart  (NOVOLOG  FLEXPEN) 100 UNIT/ML FlexPen   lisinopril  (ZESTRIL ) 10 MG tablet   Other Relevant Orders   HgB A1c   Comprehensive metabolic panel with GFR   Vaginal candidiasis   Acute Prescribed diflucan  200mg  daily for 5 days  Vaginal swab for BV and yeast ordered       Relevant Medications   fluconazole  (DIFLUCAN ) 40 MG/ML  suspension   Other Visit Diagnoses       Vaginal itching       Relevant Medications   fluconazole  (DIFLUCAN ) 40 MG/ML suspension   Other Relevant Orders   Cervicovaginal ancillary only     Vaginal pruritus         Encounter for screening mammogram for malignant neoplasm of breast       Relevant Orders   MM 3D SCREENING MAMMOGRAM BILATERAL BREAST     Screening for colon cancer       Relevant Orders   Ambulatory referral to Gastroenterology       Assessment and Plan Assessment & Plan  Vaginal candidiasis Vaginal itching for two months with white, cloudy discharge, suggestive of yeast infection. No fishy odor reported due to anosmia. - Prescribed Diflucan  suspension 200 mg for 5 days - Await results of vaginal swab   Major depressive disorder, single episode, moderate Elevated PHQ-9 score of 10 indicating moderate depression. Not currently on any prescription medication for depression. -will continue to monitor symptoms and discuss starting medication at follow up     General Health Maintenance Due for routine health maintenance screenings including mammogram, Pap smear, and colon cancer screening. No recent mammogram or Pap smear on record. - Ordered mammogram - Scheduled Pap smear - Sent referral for colon cancer screening     Return in about 4 months (around 07/11/2024) for CPE, Pap.         Rockie Agent, MD  [facility] [phone] (phone) [phone] (fax)  [facility]     [1]  Outpatient Medications Prior to Visit  Medication Sig   albuterol  (VENTOLIN  HFA) 108 (90 Base) MCG/ACT inhaler Inhale 2 puffs into the lungs every 4 (four) hours as needed for wheezing or shortness of breath.   Continuous Glucose Sensor (DEXCOM G7 SENSOR) MISC 1 Device by Does not apply route daily.   empagliflozin  (JARDIANCE ) 10 MG TABS tablet Take 1 tablet (10 mg total) by mouth daily.   gabapentin  (NEURONTIN ) 600 MG  tablet Take 1 tablet (600 mg total)  by mouth 3 (three) times daily.   lubiprostone (AMITIZA) 24 MCG capsule Take 24 mcg by mouth.   metFORMIN  (GLUCOPHAGE ) 1000 MG tablet Take 1 tablet (1,000 mg total) by mouth 2 (two) times daily with a meal.   metroNIDAZOLE  (FLAGYL ) 50 mg/ml oral suspension Take 7.4 mLs (370 mg total) by mouth 3 (three) times daily.   montelukast  (SINGULAIR ) 10 MG tablet Take 1 tablet (10 mg total) by mouth at bedtime.   pioglitazone  (ACTOS ) 45 MG tablet Take 1 tablet (45 mg total) by mouth daily.   rosuvastatin  (CRESTOR ) 20 MG tablet Take 1 tablet (20 mg total) by mouth daily.   [DISCONTINUED] gabapentin  (NEURONTIN ) 400 MG capsule Take 1 capsule (400 mg total) by mouth 3 (three) times daily.   [DISCONTINUED] insulin aspart  (NOVOLOG  FLEXPEN) 100 UNIT/ML FlexPen Inject 10 Units into the skin 3 (three) times daily with meals.   [DISCONTINUED] lisinopril  (ZESTRIL ) 5 MG tablet Take 1 tablet (5 mg total) by mouth daily.   No facility-administered medications prior to visit.

## 2024-03-13 ENCOUNTER — Ambulatory Visit

## 2024-03-13 ENCOUNTER — Telehealth: Payer: Self-pay

## 2024-03-13 ENCOUNTER — Other Ambulatory Visit: Payer: Self-pay

## 2024-03-13 DIAGNOSIS — M25612 Stiffness of left shoulder, not elsewhere classified: Secondary | ICD-10-CM

## 2024-03-13 DIAGNOSIS — M25511 Pain in right shoulder: Secondary | ICD-10-CM | POA: Diagnosis not present

## 2024-03-13 DIAGNOSIS — M25512 Pain in left shoulder: Secondary | ICD-10-CM

## 2024-03-13 DIAGNOSIS — M25611 Stiffness of right shoulder, not elsewhere classified: Secondary | ICD-10-CM

## 2024-03-13 DIAGNOSIS — Z1211 Encounter for screening for malignant neoplasm of colon: Secondary | ICD-10-CM

## 2024-03-13 LAB — COMPREHENSIVE METABOLIC PANEL WITH GFR
ALT: 29 IU/L (ref 0–32)
AST: 22 IU/L (ref 0–40)
Albumin: 4.4 g/dL (ref 3.9–4.9)
Alkaline Phosphatase: 91 IU/L (ref 41–116)
BUN/Creatinine Ratio: 13 (ref 9–23)
BUN: 12 mg/dL (ref 6–24)
Bilirubin Total: 0.7 mg/dL (ref 0.0–1.2)
CO2: 25 mmol/L (ref 20–29)
Calcium: 10.2 mg/dL (ref 8.7–10.2)
Chloride: 96 mmol/L (ref 96–106)
Creatinine, Ser: 0.92 mg/dL (ref 0.57–1.00)
Globulin, Total: 3.2 g/dL (ref 1.5–4.5)
Glucose: 323 mg/dL — ABNORMAL HIGH (ref 70–99)
Potassium: 4.7 mmol/L (ref 3.5–5.2)
Sodium: 134 mmol/L (ref 134–144)
Total Protein: 7.6 g/dL (ref 6.0–8.5)
eGFR: 77 mL/min/1.73 (ref >59)

## 2024-03-13 LAB — LIPID PANEL
Chol/HDL Ratio: 4.7 ratio — ABNORMAL HIGH (ref 0.0–4.4)
Cholesterol, Total: 292 mg/dL — ABNORMAL HIGH (ref 100–199)
HDL: 62 mg/dL (ref >39)
LDL Chol Calc (NIH): 183 mg/dL — ABNORMAL HIGH (ref 0–99)
Triglycerides: 247 mg/dL — ABNORMAL HIGH (ref 0–149)
VLDL Cholesterol Cal: 47 mg/dL — ABNORMAL HIGH (ref 5–40)

## 2024-03-13 LAB — HEMOGLOBIN A1C
Est. average glucose Bld gHb Est-mCnc: 344 mg/dL
Hgb A1c MFr Bld: 13.6 % — ABNORMAL HIGH (ref 4.8–5.6)

## 2024-03-13 NOTE — Telephone Encounter (Signed)
 Gastroenterology Pre-Procedure Review  Request Date: 06/19/24 Requesting Physician: Dr. Jinny  PATIENT REVIEW QUESTIONS: The patient responded to the following health history questions as indicated:    1. Are you having any GI issues? no 2. Do you have a personal history of Polyps? no 3. Do you have a family history of Colon Cancer or Polyps? no 4. Diabetes Mellitus? Yes takes Jardiance , Metformin , Actos , Novolog  stop dates noted on instructions pt advised to note the stop dates  5. Joint replacements in the past 12 months?no 6. Major health problems in the past 3 months?no 7. Any artificial heart valves, MVP, or defibrillator?no    MEDICATIONS & ALLERGIES:    Patient reports the following regarding taking any anticoagulation/antiplatelet therapy:   Plavix, Coumadin, Eliquis, Xarelto, Lovenox, Pradaxa, Brilinta, or Effient? no Aspirin? no  Patient confirms/reports the following medications:  Current Outpatient Medications  Medication Sig Dispense Refill   albuterol  (VENTOLIN  HFA) 108 (90 Base) MCG/ACT inhaler Inhale 2 puffs into the lungs every 4 (four) hours as needed for wheezing or shortness of breath. 8 g 6   Continuous Glucose Sensor (DEXCOM G7 SENSOR) MISC 1 Device by Does not apply route daily. 4 each 6   empagliflozin  (JARDIANCE ) 10 MG TABS tablet Take 1 tablet (10 mg total) by mouth daily. 90 tablet 01   fluconazole  (DIFLUCAN ) 40 MG/ML suspension Take 5 mLs (200 mg total) by mouth daily for 5 days. 25 mL 0   gabapentin  (NEURONTIN ) 600 MG tablet Take 1 tablet (600 mg total) by mouth 3 (three) times daily. 90 tablet 2   insulin aspart  (NOVOLOG  FLEXPEN) 100 UNIT/ML FlexPen Inject 10 Units into the skin 3 (three) times daily with meals. 15 mL 2   lisinopril  (ZESTRIL ) 10 MG tablet Take 1 tablet (10 mg total) by mouth daily. 90 tablet 1   lubiprostone (AMITIZA) 24 MCG capsule Take 24 mcg by mouth.     metFORMIN  (GLUCOPHAGE ) 1000 MG tablet Take 1 tablet (1,000 mg total) by mouth 2  (two) times daily with a meal. 180 tablet 3   metroNIDAZOLE  (FLAGYL ) 50 mg/ml oral suspension Take 7.4 mLs (370 mg total) by mouth 3 (three) times daily. 300 mL 0   montelukast  (SINGULAIR ) 10 MG tablet Take 1 tablet (10 mg total) by mouth at bedtime. 90 tablet 2   pioglitazone  (ACTOS ) 45 MG tablet Take 1 tablet (45 mg total) by mouth daily. 90 tablet 2   rosuvastatin  (CRESTOR ) 20 MG tablet Take 1 tablet (20 mg total) by mouth daily. 90 tablet 2   traZODone  (DESYREL ) 50 MG tablet Take 1 tablet (50 mg total) by mouth at bedtime. 60 tablet 3   No current facility-administered medications for this visit.    Patient confirms/reports the following allergies:  Allergies[1]  No orders of the defined types were placed in this encounter.   AUTHORIZATION INFORMATION Primary Insurance: 1D#: Group #:  Secondary Insurance: 1D#: Group #:  SCHEDULE INFORMATION: Date: 06/19/24 Time: Location: [facility]    [1] No Known Allergies

## 2024-03-13 NOTE — Therapy (Signed)
 OUTPATIENT PHYSICAL THERAPY TREATMENT   Patient Name: Chelsea Nash MRN: [id number] DOB:April 21, 1975, 48 y.o., female Today's Date: 03/13/2024  END OF SESSION:  PT End of Session - 03/13/24 1425     Visit Number 2    Number of Visits 17    Date for Recertification  04/27/24    PT Start Time 1426    PT Stop Time 1520    PT Time Calculation (min) 54 min    Activity Tolerance Patient tolerated treatment well    Behavior During Therapy WFL for tasks assessed/performed           Past Medical History:  Diagnosis Date   Asthma    Diabetes mellitus without complication (HCC)    Past Surgical History:  Procedure Laterality Date   adhesion removal after c-sections     CESAREAN SECTION     x2   TUBAL LIGATION     Patient Active Problem List   Diagnosis Date Noted   Depression, major, single episode, moderate (HCC) 03/12/2024   Vaginal candidiasis 03/12/2024   Primary insomnia 03/12/2024   Type 2 diabetes mellitus with diabetic neuropathy, with long-term current use of insulin (HCC) 09/27/2023   Hypertension associated with diabetes (HCC) 09/27/2023   Hyperlipidemia associated with type 2 diabetes mellitus (HCC) 09/27/2023   Mild intermittent asthma without complication 09/27/2023   Neuropathy of both feet 09/27/2023   Numbness of foot 09/09/2021    PCP: Sharma Coyer, MD   REFERRING PROVIDER: Kathlynn Sharper, MD  REFERRING DIAG: M75.01,M75.02 (ICD-10-CM) - Bilateral adhesive capsulitis of shoulders  THERAPY DIAG:  Right shoulder pain, unspecified chronicity  Left shoulder pain, unspecified chronicity  Stiffness of left shoulder, not elsewhere classified  Stiffness of right shoulder, not elsewhere classified  Rationale for Evaluation and Treatment: Rehabilitation  ONSET DATE: November 2025  SUBJECTIVE:                                                                                                                                                                                                          SUBJECTIVE STATEMENT: L shoulder is 9.5/10 currently. Was achy this morning going down into her fingers (digits 1-2). R shoulder is a 7/10 ache currently.   Was ok after last session.      Hand dominance: Right  PERTINENT HISTORY:  B shoulder pain. Both shoulder hurt equally. Pain began the beginning of November 2025, gradual onset. Unknown mechanism of injury. Pt also adds that she lifts her grandchildren (2 and 1 year olds who are heavy for her). R started hurting more  than the left initially but now both shoulder bother her equally. Pain has worsened since onset. Dr. 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     Blood pressure is controlled per pt.  No latex allergies.    PAIN:  Are you having pain? Yes: NPRS scale: 8/10 currently Pain location: B anterior arms, just below the shoulder joint.  Pain description: sore Aggravating factors: reaching up, behind her back, behind her head, laying on her R and L sides. Relieving factors: using other hand to help the hurting arm down when she raises it up too high.    PRECAUTIONS: no known precautions  RED FLAGS: Cervical red flags: Dysphagia No, Dysmetria No, Diplopia No, Nystagmus No, and Nausea No, Bowel or bladder incontinence: No, and Cauda equina syndrome: No     WEIGHT BEARING RESTRICTIONS: No  FALLS:  Has patient fallen in last 6 months? No  LIVING ENVIRONMENT: Lives with: lives with their daughter Lives in: House/apartment Stairs: No Has following equipment at home: None  OCCUPATION: Part time with home health.   PLOF: Independent  PATIENT GOALS: Make shoulders feel better.   NEXT MD VISIT: none scheduled yet  OBJECTIVE:  Note: Objective measures were completed at Evaluation unless otherwise noted.  DIAGNOSTIC FINDINGS:    PATIENT SURVEYS:  UEFS  Extreme difficulty/unable (0), Quite a bit of difficulty (1), Moderate difficulty (2),  Little difficulty (3), No difficulty (4) Survey date:  03/01/2024  Any of your usual work, household or school activities 1  2. Your usual hobbies, recreational/sport activities 1   3. Lifting a bag of groceries to waist level 0   4. Lifting a bag of groceries above your head 0  5. Grooming your hair 1  6. Pushing up on your hands (I.e. from bathtub or chair) 1  7. Preparing food (I.e. peeling/cutting) 3  8. Driving  3  9. Vacuuming, sweeping, or raking 2  10. Dressing  3  11. Doing up buttons 3  12. Using tools/appliances 0  13. Opening doors 2  14. Cleaning  2  15. Tying or lacing shoes 0  16. Sleeping  0  17. Laundering clothes (I.e. washing, ironing, folding) 3  18. Opening a jar 3  19. Throwing a ball 0  20. Carrying a small suitcase with your affected limb.  0  Score total:  28/80     COGNITION: Overall cognitive status: Within functional limits for tasks assessed  SENSATION: Tinging at tips of L 3-4 digits of L hand.  Tingling R and L anterior arm   POSTURE: slightly forward neck, R shoulder lower, R scapular protraction compared to L  PALPATION: TTP R and L subacromial area (anterior and lateral), R and L anterior arm  Muscle tension B upper trap, B cervical paraspinal muscles  L shoulder joint: decreased inferior and posterior joint mobility  Feels good with L posterior shoulder joint mobilization   R shoulder joint: decreased inferior and posterior joint mobility       CERVICAL ROM:   Active ROM A/PROM (deg) eval  Flexion Full with B posterior shoulder pulling  Extension WFL with B anterior lateral neck pain  Right lateral flexion WFL with L lateral neck pull  Left lateral flexion WFL  Right rotation [facility] with R inferior lateral neck pain  Left rotation WFL with L inferior lateral neck pain   (Blank rows = not tested)  UPPER EXTREMITY ROM:  Active ROM Right eval Left eval  Shoulder flexion  76 (93 AAROM with stiff end feel, anterior shoulder  pain) 56 (60 AAROM, empty to stiff end feel, L lateral proximal arm pain)  Shoulder extension    Shoulder abduction 78 (86 AAROM, stiff and painful end feel) 40 (50 degrees AAROM with stiff and painful end feel); L shoulder shrug and R trunk side bend compensation  Shoulder adduction    Shoulder extension    Shoulder internal rotation 55 stiff and painful end feel (At 32 deg abduction)   88, stiff end feel  Shoulder external rotation 82 stiff and painful end feel At 32 deg abduction)  74 (stiff and painful end feel)  Elbow flexion    Elbow extension    Wrist flexion    Wrist extension    Wrist ulnar deviation    Wrist radial deviation    Wrist pronation    Wrist supination     (Blank rows = not tested)  UPPER EXTREMITY MMT:  MMT Right eval Left eval  Shoulder flexion 4 4  Shoulder extension    Shoulder abduction 4 4-  Shoulder adduction    Shoulder extension    Shoulder internal rotation 4- 4-  Shoulder external rotation 4- 4  Middle trapezius    Lower trapezius (seated manually resisted)  4- 3+  Elbow flexion    Elbow extension    Wrist flexion    Wrist extension    Wrist ulnar deviation    Wrist radial deviation    Wrist pronation    Wrist supination    Grip strength     (Blank rows = not tested)  CERVICAL SPECIAL TESTS:    FUNCTIONAL TESTS:    TREATMENT DATE: 03/13/2024                                                                                                                               Manual therapy  Supine with L arm in slight abduction   Posterior and inferior glide L humeral head grade 3- to 3 to promote mobility, decrease stiffness  Posterior and inferior glide R humeral head grade grade 3- to 3  Supine STM B cervical paraspinal muscles to decrease tension      Therapeutic exercise   Supine cervical nod 10x5 seconds. Discomfort B upper trap area, decreases with rest  Seated B scapular retraction. Upper trap discomfort.   Supine B  scapular retraction 10x5 seconds for 3 sets  Seated triceps extension isometrics, hand on table  L 10x5 seconds for 3 sets   R 10x5 seconds for 3 sets  8/10 L, R 7/10 after aforementioned treatment.   Standign shoulder IR isometrics, hand on abdomen  L 10x2 with 5 second  Seated self L shoulder inferior glide 10x5 seconds  L shoulder feels better afterwards.    Reviewed HEP. Pt demonstrated and verbalized understanding.    Improved exercise technique, movement at target joints, use of target muscles after mod verbal, visual, tactile cues.  PATIENT EDUCATION:  Education details: There-ex, HEP Person educated: Patient Education method: Explanation, Demonstration, Tactile cues, Verbal cues, and Handouts Education comprehension: verbalized understanding and returned demonstration  HOME EXERCISE PROGRAM: Access Code: [email or web address] URL: [email or web address] Date: 03/13/2024 Prepared by: Emil Glassman  Exercises - Supine Scapular Retraction  - 2 x daily - 7 x weekly - 3 sets - 10 reps - 5 seconds hold     ASSESSMENT:  CLINICAL IMPRESSION: Worked on improving posterior and inferior glide B humeral head and improve joint mobility. Decreased L shoulder joint pain with seated self inferior shoulder mobilization exercise which was given as part of her HEP.  Pt will benefit from continued skilled physical therapy services to decrease pain, improve joint mobility, ROM, strength and function.     OBJECTIVE IMPAIRMENTS: decreased ROM, decreased strength, impaired UE functional use, improper body mechanics, postural dysfunction, and pain.   ACTIVITY LIMITATIONS: carrying, lifting, sleeping, bed mobility, bathing, toileting, dressing, reach over head, and hygiene/grooming  PARTICIPATION LIMITATIONS:   PERSONAL FACTORS: Fitness, Profession, and 1 comorbidity: DM are also affecting patient's functional outcome.   REHAB POTENTIAL: Fair    CLINICAL DECISION MAKING:  Stable/uncomplicated  EVALUATION COMPLEXITY: Low   GOALS: Goals reviewed with patient? Yes  SHORT TERM GOALS: Target date: 03/16/2024  Pt will be independent with her initial HEP to decrease pain, improve ROM, strength, function, and ability to reach and lay on her sides more comfortably.  Baseline: Pt has not yet started her initial HEP (03/01/2024) Goal status: INITIAL     LONG TERM GOALS: Target date: 04/27/2024  Pt will have a decrease in B shoulder pain to 3/10 or less at worst to promote ability to reach, as well as lay on her R and L sides for sleep more comfortably.  Baseline: R anterior shoulder and arm 8/10, L anterior shoulder and arm 9/10 at most for the past 3 months (03/01/2024) Goal status: INITIAL  2.  Pt will improve B shoulder flexion and abduction AROM by at least 30 degrees to promote ability to reach more comfortably.  Baseline:  Active ROM Right eval Left eval  Shoulder flexion 76 (93 AAROM with stiff end feel, anterior shoulder pain) 56 (60 AAROM, empty to stiff end feel, L lateral proximal arm pain)  Shoulder abduction 78 (86 AAROM, stiff and painful end feel) 40 (50 degrees AAROM with stiff and painful end feel); L shoulder shrug and R trunk side bend compensation   Goal status: INITIAL  3.  Pt will improve her R shoulder ER and R and L shoulder IR AROM (at at least 75 degrees abduction) by at least 10 degrees to promote ability to reach behind her back and head more comfortably.  Baseline:  Active ROM Right eval Left eval  Shoulder internal rotation 55 stiff and painful end feel (At 32 deg abduction)   88, stiff end feel  Shoulder external rotation 82 stiff and painful end feel At 32 deg abduction)  74 (stiff and painful end feel)   Goal status: INITIAL  4.  Pt will improve her UEFI score by at least 10 points as a demonstration of improved function.  Baseline: 28/80 (03/01/2024) Goal status: INITIAL    PLAN:  PT FREQUENCY: 1-2x/week  PT  DURATION: 8 weeks  PLANNED INTERVENTIONS: 97110-Therapeutic exercises, 97530- Therapeutic activity, 97112- Neuromuscular re-education, 97535- Self Care, [id number]- Manual therapy, [id number]- Electrical stimulation (unattended), [phone]- Ionotophoresis 4mg /ml Dexamethasone, 79439 (1-2 muscles), 20561 (3+ muscles)- Dry Needling, Patient/Family education, Joint mobilization, and Spinal mobilization  PLAN FOR NEXT SESSION: Manual techniques such as joint mobilization; thoracic extension, scapular and ER muscle strengthening, modalities PRN   Billi Bright, PT, DPT 03/13/2024, 3:31 PM

## 2024-03-14 ENCOUNTER — Ambulatory Visit: Payer: Self-pay | Admitting: Family Medicine

## 2024-03-14 LAB — CERVICOVAGINAL ANCILLARY ONLY
Bacterial Vaginitis (gardnerella): POSITIVE — AB
Candida Glabrata: POSITIVE — AB
Candida Vaginitis: POSITIVE — AB
Chlamydia: NEGATIVE
Comment: NEGATIVE
Comment: NEGATIVE
Comment: NEGATIVE
Comment: NEGATIVE
Comment: NEGATIVE
Comment: NORMAL
Neisseria Gonorrhea: NEGATIVE
Trichomonas: NEGATIVE

## 2024-03-15 ENCOUNTER — Ambulatory Visit

## 2024-03-15 NOTE — Telephone Encounter (Signed)
 No show. Called patient who said she thought the appointment was at 2 pm. Will be able to make it to her next 2 follow up sessions next week. Date and times were verified.

## 2024-03-19 ENCOUNTER — Ambulatory Visit

## 2024-03-19 DIAGNOSIS — M25612 Stiffness of left shoulder, not elsewhere classified: Secondary | ICD-10-CM

## 2024-03-19 DIAGNOSIS — M25511 Pain in right shoulder: Secondary | ICD-10-CM | POA: Diagnosis not present

## 2024-03-19 DIAGNOSIS — M25611 Stiffness of right shoulder, not elsewhere classified: Secondary | ICD-10-CM

## 2024-03-19 DIAGNOSIS — M25512 Pain in left shoulder: Secondary | ICD-10-CM

## 2024-03-19 NOTE — Therapy (Signed)
 " OUTPATIENT PHYSICAL THERAPY TREATMENT   Patient Name: Chelsea Nash MRN: [id number] DOB:1976-01-07, 48 y.o., female Today's Date: 03/19/2024  END OF SESSION:  PT End of Session - 03/19/24 0901     Visit Number 3    Number of Visits 17    Date for Recertification  04/27/24    PT Start Time 0901    PT Stop Time 0945    PT Time Calculation (min) 44 min    Activity Tolerance Patient tolerated treatment well    Behavior During Therapy WFL for tasks assessed/performed          Past Medical History:  Diagnosis Date   Asthma    Diabetes mellitus without complication (HCC)    Past Surgical History:  Procedure Laterality Date   adhesion removal after c-sections     CESAREAN SECTION     x2   TUBAL LIGATION     Patient Active Problem List   Diagnosis Date Noted   Depression, major, single episode, moderate (HCC) 03/12/2024   Vaginal candidiasis 03/12/2024   Primary insomnia 03/12/2024   Type 2 diabetes mellitus with diabetic neuropathy, with long-term current use of insulin (HCC) 09/27/2023   Hypertension associated with diabetes (HCC) 09/27/2023   Hyperlipidemia associated with type 2 diabetes mellitus (HCC) 09/27/2023   Mild intermittent asthma without complication 09/27/2023   Neuropathy of both feet 09/27/2023   Numbness of foot 09/09/2021    PCP: Sharma Coyer, MD   REFERRING PROVIDER: Kathlynn Sharper, MD  REFERRING DIAG: M75.01,M75.02 (ICD-10-CM) - Bilateral adhesive capsulitis of shoulders  THERAPY DIAG:  Right shoulder pain, unspecified chronicity  Left shoulder pain, unspecified chronicity  Stiffness of left shoulder, not elsewhere classified  Stiffness of right shoulder, not elsewhere classified  Rationale for Evaluation and Treatment: Rehabilitation  ONSET DATE: November 2025  SUBJECTIVE:                                                                                                                                                                                                          SUBJECTIVE STATEMENT:  Pt reports that the L shoulder is at a 7/10, and 5/10 on the R.  Pt reports that she worked over the weekend.     Hand dominance: Right  PERTINENT HISTORY:  B shoulder pain. Both shoulder hurt equally. Pain began the beginning of November 2025, gradual onset. Unknown mechanism of injury. Pt also adds that she lifts her grandchildren (2 and 1 year olds who are heavy for her). R started hurting more than the left initially but now  both shoulder bother her equally. Pain has worsened since onset. Dr. 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     Blood pressure is controlled per pt.  No latex allergies.    PAIN:  Are you having pain? Yes: NPRS scale: 8/10 currently Pain location: B anterior arms, just below the shoulder joint.  Pain description: sore Aggravating factors: reaching up, behind her back, behind her head, laying on her R and L sides. Relieving factors: using other hand to help the hurting arm down when she raises it up too high.    PRECAUTIONS: no known precautions  RED FLAGS: Cervical red flags: Dysphagia No, Dysmetria No, Diplopia No, Nystagmus No, and Nausea No, Bowel or bladder incontinence: No, and Cauda equina syndrome: No     WEIGHT BEARING RESTRICTIONS: No  FALLS:  Has patient fallen in last 6 months? No  LIVING ENVIRONMENT: Lives with: lives with their daughter Lives in: House/apartment Stairs: No Has following equipment at home: None  OCCUPATION: Part time with home health.   PLOF: Independent  PATIENT GOALS: Make shoulders feel better.   NEXT MD VISIT: none scheduled yet  OBJECTIVE:  Note: Objective measures were completed at Evaluation unless otherwise noted.  DIAGNOSTIC FINDINGS:    PATIENT SURVEYS:  UEFS  Extreme difficulty/unable (0), Quite a bit of difficulty (1), Moderate difficulty (2), Little difficulty (3), No difficulty (4) Survey date:   03/01/2024  Any of your usual work, household or school activities 1  2. Your usual hobbies, recreational/sport activities 1   3. Lifting a bag of groceries to waist level 0   4. Lifting a bag of groceries above your head 0  5. Grooming your hair 1  6. Pushing up on your hands (I.e. from bathtub or chair) 1  7. Preparing food (I.e. peeling/cutting) 3  8. Driving  3  9. Vacuuming, sweeping, or raking 2  10. Dressing  3  11. Doing up buttons 3  12. Using tools/appliances 0  13. Opening doors 2  14. Cleaning  2  15. Tying or lacing shoes 0  16. Sleeping  0  17. Laundering clothes (I.e. washing, ironing, folding) 3  18. Opening a jar 3  19. Throwing a ball 0  20. Carrying a small suitcase with your affected limb.  0  Score total:  28/80     COGNITION: Overall cognitive status: Within functional limits for tasks assessed  SENSATION: Tinging at tips of L 3-4 digits of L hand.  Tingling R and L anterior arm   POSTURE: slightly forward neck, R shoulder lower, R scapular protraction compared to L  PALPATION: TTP R and L subacromial area (anterior and lateral), R and L anterior arm  Muscle tension B upper trap, B cervical paraspinal muscles  L shoulder joint: decreased inferior and posterior joint mobility  Feels good with L posterior shoulder joint mobilization   R shoulder joint: decreased inferior and posterior joint mobility       CERVICAL ROM:   Active ROM A/PROM (deg) eval  Flexion Full with B posterior shoulder pulling  Extension WFL with B anterior lateral neck pain  Right lateral flexion WFL with L lateral neck pull  Left lateral flexion WFL  Right rotation [facility] with R inferior lateral neck pain  Left rotation WFL with L inferior lateral neck pain   (Blank rows = not tested)  UPPER EXTREMITY ROM:  Active ROM Right eval Left eval  Shoulder flexion 76 (93 AAROM with stiff end  feel, anterior shoulder pain) 56 (60 AAROM, empty to stiff end feel, L lateral  proximal arm pain)  Shoulder extension    Shoulder abduction 78 (86 AAROM, stiff and painful end feel) 40 (50 degrees AAROM with stiff and painful end feel); L shoulder shrug and R trunk side bend compensation  Shoulder adduction    Shoulder extension    Shoulder internal rotation 55 stiff and painful end feel (At 32 deg abduction)   88, stiff end feel  Shoulder external rotation 82 stiff and painful end feel At 32 deg abduction)  74 (stiff and painful end feel)  Elbow flexion    Elbow extension    Wrist flexion    Wrist extension    Wrist ulnar deviation    Wrist radial deviation    Wrist pronation    Wrist supination     (Blank rows = not tested)  UPPER EXTREMITY MMT:  MMT Right eval Left eval  Shoulder flexion 4 4  Shoulder extension    Shoulder abduction 4 4-  Shoulder adduction    Shoulder extension    Shoulder internal rotation 4- 4-  Shoulder external rotation 4- 4  Middle trapezius    Lower trapezius (seated manually resisted)  4- 3+  Elbow flexion    Elbow extension    Wrist flexion    Wrist extension    Wrist ulnar deviation    Wrist radial deviation    Wrist pronation    Wrist supination    Grip strength     (Blank rows = not tested)  CERVICAL SPECIAL TESTS:    FUNCTIONAL TESTS:    TREATMENT DATE: 03/19/2024                                                                                                                               Manual:  Supine long axis distraction of the L UE for pain relief and to increase joint spacing, 30 sec bouts Supine inferior glide to increase abduction/flexion of the L shoulder, Grades III-IV, for increased ROM Supine AP mobilization to increase flexion/IR of the L shoulder, Grades III-IV, for increased ROM  Supine STM with TP release technique applied to UT and deltoid region of the L shoulder for pain relief and increased tissue extensibility   TherEx:  Supine rhythmic stabilization, 30 sec bout eyes  open  Supine rhythmic stabilization, 30 sec bout with eyes closed  Supine cervical retractions into pillow, 5 sec holds, 2x10  Standing B scapular retraction with RTB, 2x10  Cues for slowed performance  Standing B resisted ER with RTB, 2x10  Seated mid rows at OMEGA, 15#, 2x10  Standing resisted tricep extensions, GTB, 2x10    Improved exercise technique, movement at target joints, use of target muscles after mod verbal, visual, tactile cues.      PATIENT EDUCATION:  Education details: There-ex, HEP Person educated: Patient Education method: Explanation, Demonstration, Tactile cues, Verbal cues, and Handouts Education comprehension:  verbalized understanding and returned demonstration  HOME EXERCISE PROGRAM: Access Code: [email or web address] URL: [email or web address] Date: 03/13/2024 Prepared by: Emil Glassman  Exercises - Supine Scapular Retraction  - 2 x daily - 7 x weekly - 3 sets - 10 reps - 5 seconds hold     ASSESSMENT:  CLINICAL IMPRESSION:  Pt responded favorably to the exercises and noted them to be challenging, but good overall.  Pt self-reported them to be medium in difficulty.  Pt denied any extra pain upon leaving the clinic, however was concerned about potential soreness.  Pt advised to monitor over the next 24-48 hours due to likelihood of working muscles pt is not used to utilizing.  Pt agreeable and will continue to perform HEP to improve overall strength outside of the clinic.   Pt will continue to benefit from skilled therapy to address remaining deficits in order to improve overall QoL and return to PLOF.       OBJECTIVE IMPAIRMENTS: decreased ROM, decreased strength, impaired UE functional use, improper body mechanics, postural dysfunction, and pain.   ACTIVITY LIMITATIONS: carrying, lifting, sleeping, bed mobility, bathing, toileting, dressing, reach over head, and hygiene/grooming  PARTICIPATION LIMITATIONS:   PERSONAL FACTORS: Fitness,  Profession, and 1 comorbidity: DM are also affecting patient's functional outcome.   REHAB POTENTIAL: Fair    CLINICAL DECISION MAKING: Stable/uncomplicated  EVALUATION COMPLEXITY: Low   GOALS: Goals reviewed with patient? Yes  SHORT TERM GOALS: Target date: 03/16/2024  Pt will be independent with her initial HEP to decrease pain, improve ROM, strength, function, and ability to reach and lay on her sides more comfortably.  Baseline: Pt has not yet started her initial HEP (03/01/2024) Goal status: INITIAL     LONG TERM GOALS: Target date: 04/27/2024  Pt will have a decrease in B shoulder pain to 3/10 or less at worst to promote ability to reach, as well as lay on her R and L sides for sleep more comfortably.  Baseline: R anterior shoulder and arm 8/10, L anterior shoulder and arm 9/10 at most for the past 3 months (03/01/2024) Goal status: INITIAL  2.  Pt will improve B shoulder flexion and abduction AROM by at least 30 degrees to promote ability to reach more comfortably.  Baseline:  Active ROM Right eval Left eval  Shoulder flexion 76 (93 AAROM with stiff end feel, anterior shoulder pain) 56 (60 AAROM, empty to stiff end feel, L lateral proximal arm pain)  Shoulder abduction 78 (86 AAROM, stiff and painful end feel) 40 (50 degrees AAROM with stiff and painful end feel); L shoulder shrug and R trunk side bend compensation   Goal status: INITIAL  3.  Pt will improve her R shoulder ER and R and L shoulder IR AROM (at at least 75 degrees abduction) by at least 10 degrees to promote ability to reach behind her back and head more comfortably.  Baseline:  Active ROM Right eval Left eval  Shoulder internal rotation 55 stiff and painful end feel (At 32 deg abduction)   88, stiff end feel  Shoulder external rotation 82 stiff and painful end feel At 32 deg abduction)  74 (stiff and painful end feel)   Goal status: INITIAL  4.  Pt will improve her UEFI score by at least 10  points as a demonstration of improved function.  Baseline: 28/80 (03/01/2024) Goal status: INITIAL    PLAN:  PT FREQUENCY: 1-2x/week  PT DURATION: 8 weeks  PLANNED INTERVENTIONS: 97110-Therapeutic exercises, 97530- Therapeutic activity, [id number]- Neuromuscular re-education,  02464- Self Care, 02859- Manual therapy, [id number]- Electrical stimulation (unattended), [phone]- Ionotophoresis 4mg /ml Dexamethasone, 20560 (1-2 muscles), 20561 (3+ muscles)- Dry Needling, Patient/Family education, Joint mobilization, and Spinal mobilization  PLAN FOR NEXT SESSION:  Manual techniques such as joint mobilization; thoracic extension, scapular and ER muscle strengthening, modalities PRN   Fonda Simpers, PT, DPT Physical Therapist - [facility] [place]  03/19/2024, 9:49 AM     "

## 2024-03-21 ENCOUNTER — Telehealth: Payer: Self-pay

## 2024-03-21 ENCOUNTER — Ambulatory Visit

## 2024-03-21 NOTE — Telephone Encounter (Signed)
 No show. Called patient and left a message pertaining to appointment and a reminder for the next follow up session. Pt was also reminded of the no show policy.  Return phone call requested. Phone number [phone]) provided.

## 2024-03-21 NOTE — Telephone Encounter (Signed)
 Patient called back and thought that the clinic was closed for Christmas.

## 2024-03-26 ENCOUNTER — Ambulatory Visit

## 2024-03-26 DIAGNOSIS — M25512 Pain in left shoulder: Secondary | ICD-10-CM

## 2024-03-26 DIAGNOSIS — M25511 Pain in right shoulder: Secondary | ICD-10-CM

## 2024-03-26 DIAGNOSIS — M25611 Stiffness of right shoulder, not elsewhere classified: Secondary | ICD-10-CM

## 2024-03-26 DIAGNOSIS — M25612 Stiffness of left shoulder, not elsewhere classified: Secondary | ICD-10-CM

## 2024-03-26 NOTE — Therapy (Signed)
 " OUTPATIENT PHYSICAL THERAPY TREATMENT   Patient Name: Chelsea Nash MRN: [id number] DOB:01/19/1976, 48 y.o., female Today's Date: 03/26/2024  END OF SESSION:  PT End of Session - 03/26/24 0954     Visit Number 4    Number of Visits 17    Date for Recertification  04/27/24    PT Start Time 0954    PT Stop Time 1026    PT Time Calculation (min) 32 min    Activity Tolerance Patient tolerated treatment well    Behavior During Therapy WFL for tasks assessed/performed           Past Medical History:  Diagnosis Date   Asthma    Diabetes mellitus without complication (HCC)    Past Surgical History:  Procedure Laterality Date   adhesion removal after c-sections     CESAREAN SECTION     x2   TUBAL LIGATION     Patient Active Problem List   Diagnosis Date Noted   Depression, major, single episode, moderate (HCC) 03/12/2024   Vaginal candidiasis 03/12/2024   Primary insomnia 03/12/2024   Type 2 diabetes mellitus with diabetic neuropathy, with long-term current use of insulin (HCC) 09/27/2023   Hypertension associated with diabetes (HCC) 09/27/2023   Hyperlipidemia associated with type 2 diabetes mellitus (HCC) 09/27/2023   Mild intermittent asthma without complication 09/27/2023   Neuropathy of both feet 09/27/2023   Numbness of foot 09/09/2021    PCP: Sharma Coyer, MD   REFERRING PROVIDER: Kathlynn Sharper, MD  REFERRING DIAG: M75.01,M75.02 (ICD-10-CM) - Bilateral adhesive capsulitis of shoulders  THERAPY DIAG:  Right shoulder pain, unspecified chronicity  Left shoulder pain, unspecified chronicity  Stiffness of left shoulder, not elsewhere classified  Stiffness of right shoulder, not elsewhere classified  Rationale for Evaluation and Treatment: Rehabilitation  ONSET DATE: November 2025  SUBJECTIVE:                                                                                                                                                                                                          SUBJECTIVE STATEMENT: R shoulder is a whole lot better. The L shoulder still hurts. Really feels L shoulder pain when waking up after sleeping. Feels it when she puts pressure on L shoulder. 8/10 L shoulder, 4/10 R shoulder pain currently. Feels popping in R shoulder when she raises it up.     Hand dominance: Right  PERTINENT HISTORY:  B shoulder pain. Both shoulder hurt equally. Pain began the beginning of November 2025, gradual onset. Unknown mechanism of injury. Pt also adds  that she lifts her grandchildren (2 and 1 year olds who are heavy for her). R started hurting more than the left initially but now both shoulder bother her equally. Pain has worsened since onset. Dr. 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     Blood pressure is controlled per pt.  No latex allergies.    PAIN:  Are you having pain? Yes: NPRS scale: 8/10 currently Pain location: B anterior arms, just below the shoulder joint.  Pain description: sore Aggravating factors: reaching up, behind her back, behind her head, laying on her R and L sides. Relieving factors: using other hand to help the hurting arm down when she raises it up too high.    PRECAUTIONS: no known precautions  RED FLAGS: Cervical red flags: Dysphagia No, Dysmetria No, Diplopia No, Nystagmus No, and Nausea No, Bowel or bladder incontinence: No, and Cauda equina syndrome: No     WEIGHT BEARING RESTRICTIONS: No  FALLS:  Has patient fallen in last 6 months? No  LIVING ENVIRONMENT: Lives with: lives with their daughter Lives in: House/apartment Stairs: No Has following equipment at home: None  OCCUPATION: Part time with home health.   PLOF: Independent  PATIENT GOALS: Make shoulders feel better.   NEXT MD VISIT: none scheduled yet  OBJECTIVE:  Note: Objective measures were completed at Evaluation unless otherwise noted.  DIAGNOSTIC FINDINGS:    PATIENT  SURVEYS:  UEFS  Extreme difficulty/unable (0), Quite a bit of difficulty (1), Moderate difficulty (2), Little difficulty (3), No difficulty (4) Survey date:  03/01/2024  Any of your usual work, household or school activities 1  2. Your usual hobbies, recreational/sport activities 1   3. Lifting a bag of groceries to waist level 0   4. Lifting a bag of groceries above your head 0  5. Grooming your hair 1  6. Pushing up on your hands (I.e. from bathtub or chair) 1  7. Preparing food (I.e. peeling/cutting) 3  8. Driving  3  9. Vacuuming, sweeping, or raking 2  10. Dressing  3  11. Doing up buttons 3  12. Using tools/appliances 0  13. Opening doors 2  14. Cleaning  2  15. Tying or lacing shoes 0  16. Sleeping  0  17. Laundering clothes (I.e. washing, ironing, folding) 3  18. Opening a jar 3  19. Throwing a ball 0  20. Carrying a small suitcase with your affected limb.  0  Score total:  28/80     COGNITION: Overall cognitive status: Within functional limits for tasks assessed  SENSATION: Tinging at tips of L 3-4 digits of L hand.  Tingling R and L anterior arm   POSTURE: slightly forward neck, R shoulder lower, R scapular protraction compared to L  PALPATION: TTP R and L subacromial area (anterior and lateral), R and L anterior arm  Muscle tension B upper trap, B cervical paraspinal muscles  L shoulder joint: decreased inferior and posterior joint mobility  Feels good with L posterior shoulder joint mobilization   R shoulder joint: decreased inferior and posterior joint mobility       CERVICAL ROM:   Active ROM A/PROM (deg) eval  Flexion Full with B posterior shoulder pulling  Extension WFL with B anterior lateral neck pain  Right lateral flexion WFL with L lateral neck pull  Left lateral flexion WFL  Right rotation [facility] with R inferior lateral neck pain  Left rotation WFL with L inferior lateral neck pain   (  Blank rows = not tested)  UPPER EXTREMITY  ROM:  Active ROM Right eval Left eval  Shoulder flexion 76 (93 AAROM with stiff end feel, anterior shoulder pain) 56 (60 AAROM, empty to stiff end feel, L lateral proximal arm pain)  Shoulder extension    Shoulder abduction 78 (86 AAROM, stiff and painful end feel) 40 (50 degrees AAROM with stiff and painful end feel); L shoulder shrug and R trunk side bend compensation  Shoulder adduction    Shoulder extension    Shoulder internal rotation 55 stiff and painful end feel (At 32 deg abduction)   88, stiff end feel  Shoulder external rotation 82 stiff and painful end feel At 32 deg abduction)  74 (stiff and painful end feel)  Elbow flexion    Elbow extension    Wrist flexion    Wrist extension    Wrist ulnar deviation    Wrist radial deviation    Wrist pronation    Wrist supination     (Blank rows = not tested)  UPPER EXTREMITY MMT:  MMT Right eval Left eval  Shoulder flexion 4 4  Shoulder extension    Shoulder abduction 4 4-  Shoulder adduction    Shoulder extension    Shoulder internal rotation 4- 4-  Shoulder external rotation 4- 4  Middle trapezius    Lower trapezius (seated manually resisted)  4- 3+  Elbow flexion    Elbow extension    Wrist flexion    Wrist extension    Wrist ulnar deviation    Wrist radial deviation    Wrist pronation    Wrist supination    Grip strength     (Blank rows = not tested)  CERVICAL SPECIAL TESTS:    FUNCTIONAL TESTS:    TREATMENT DATE: 03/26/2024                                                                                                                               Manual:  Supine with L arm in abduction   Posterior and inferior glide L humeral head grade 3 to 3+ to improve mobility   Supine with L shoulder in flexion   STM L teres major muscle to decrease tension     TherEx:  Seated L self shoulder inferior glide 10x5 seconds for 3 sets  Increased L shoulder discomfort with large stretch  Symptoms eases  off quickly with medium level stretch.    Seated L triceps extension isometrics 10x5 seconds for 2 sets    Standing B resisted ER with RTB, 2x10     Improved exercise technique, movement at target joints, use of target muscles after mod verbal, visual, tactile cues.      PATIENT EDUCATION:  Education details: There-ex, HEP Person educated: Patient Education method: Explanation, Demonstration, Tactile cues, Verbal cues, and Handouts Education comprehension: verbalized understanding and returned demonstration  HOME EXERCISE PROGRAM: Access Code: [email or web address] URL: [email or web address] Date: 03/26/2024 Prepared by:  [facility]  Exercises - Supine Scapular Retraction  - 2 x daily - 7 x weekly - 3 sets - 10 reps - 5 seconds hold - Seated Shoulder Inferior Glide  - 1 x daily - 7 x weekly - 3 sets - 10 reps - 5 seconds hold    ASSESSMENT:  CLINICAL IMPRESSION: Worked on improving L shoulder joint mobility, as well as promoting posterior and inferior glide of L humeral head to decrease stress to her shoulder. Fair tolerance to today's session. Pt will benefit from continued skilled physical therapy services to decrease pain, improve strength and function.       OBJECTIVE IMPAIRMENTS: decreased ROM, decreased strength, impaired UE functional use, improper body mechanics, postural dysfunction, and pain.   ACTIVITY LIMITATIONS: carrying, lifting, sleeping, bed mobility, bathing, toileting, dressing, reach over head, and hygiene/grooming  PARTICIPATION LIMITATIONS:   PERSONAL FACTORS: Fitness, Profession, and 1 comorbidity: DM are also affecting patient's functional outcome.   REHAB POTENTIAL: Fair    CLINICAL DECISION MAKING: Stable/uncomplicated  EVALUATION COMPLEXITY: Low   GOALS: Goals reviewed with patient? Yes  SHORT TERM GOALS: Target date: 03/16/2024  Pt will be independent with her initial HEP to decrease pain, improve ROM, strength, function,  and ability to reach and lay on her sides more comfortably.  Baseline: Pt has not yet started her initial HEP (03/01/2024); no questions (03/26/2024) Goal status: MET     LONG TERM GOALS: Target date: 04/27/2024  Pt will have a decrease in B shoulder pain to 3/10 or less at worst to promote ability to reach, as well as lay on her R and L sides for sleep more comfortably.  Baseline: R anterior shoulder and arm 8/10, L anterior shoulder and arm 9/10 at most for the past 3 months (03/01/2024) Goal status: INITIAL  2.  Pt will improve B shoulder flexion and abduction AROM by at least 30 degrees to promote ability to reach more comfortably.  Baseline:  Active ROM Right eval Left eval  Shoulder flexion 76 (93 AAROM with stiff end feel, anterior shoulder pain) 56 (60 AAROM, empty to stiff end feel, L lateral proximal arm pain)  Shoulder abduction 78 (86 AAROM, stiff and painful end feel) 40 (50 degrees AAROM with stiff and painful end feel); L shoulder shrug and R trunk side bend compensation   Goal status: INITIAL  3.  Pt will improve her R shoulder ER and R and L shoulder IR AROM (at at least 75 degrees abduction) by at least 10 degrees to promote ability to reach behind her back and head more comfortably.  Baseline:  Active ROM Right eval Left eval  Shoulder internal rotation 55 stiff and painful end feel (At 32 deg abduction)   88, stiff end feel  Shoulder external rotation 82 stiff and painful end feel At 32 deg abduction)  74 (stiff and painful end feel)   Goal status: INITIAL  4.  Pt will improve her UEFI score by at least 10 points as a demonstration of improved function.  Baseline: 28/80 (03/01/2024) Goal status: INITIAL    PLAN:  PT FREQUENCY: 1-2x/week  PT DURATION: 8 weeks  PLANNED INTERVENTIONS: 97110-Therapeutic exercises, 97530- Therapeutic activity, 97112- Neuromuscular re-education, 97535- Self Care, [id number]- Manual therapy, [id number]- Electrical stimulation  (unattended), [phone]- Ionotophoresis 4mg /ml Dexamethasone, 79439 (1-2 muscles), 20561 (3+ muscles)- Dry Needling, Patient/Family education, Joint mobilization, and Spinal mobilization  PLAN FOR NEXT SESSION:  Manual techniques such as joint mobilization; thoracic extension, scapular and ER muscle strengthening, modalities PRN  Emil Glassman PT, DPT  Physical Therapist - [facility]  03/26/2024, 12:53 PM     "

## 2024-03-28 ENCOUNTER — Ambulatory Visit

## 2024-04-04 ENCOUNTER — Ambulatory Visit: Attending: Orthopedic Surgery

## 2024-04-04 ENCOUNTER — Telehealth: Payer: Self-pay

## 2024-04-04 DIAGNOSIS — M25511 Pain in right shoulder: Secondary | ICD-10-CM | POA: Insufficient documentation

## 2024-04-04 DIAGNOSIS — M25512 Pain in left shoulder: Secondary | ICD-10-CM | POA: Insufficient documentation

## 2024-04-04 DIAGNOSIS — M25612 Stiffness of left shoulder, not elsewhere classified: Secondary | ICD-10-CM | POA: Insufficient documentation

## 2024-04-04 DIAGNOSIS — M25611 Stiffness of right shoulder, not elsewhere classified: Secondary | ICD-10-CM | POA: Insufficient documentation

## 2024-04-04 NOTE — Telephone Encounter (Signed)
 No show. Called patient who said that she works third shift and needs later appointments. 8:15 am and 3:15 pm can work for her.   Rescheduled her Friday appointment (04/06/24) from 9 am to 8:15 am.

## 2024-04-05 ENCOUNTER — Telehealth: Payer: Self-pay

## 2024-04-05 NOTE — Telephone Encounter (Signed)
 Called patient and updated her with her new schedule with Medford Pall, DPT for the later appointments requested. Pt also confirmed tomorrow's appointment 04/06/2024 at 8:15 am

## 2024-04-06 ENCOUNTER — Ambulatory Visit

## 2024-04-06 DIAGNOSIS — M25611 Stiffness of right shoulder, not elsewhere classified: Secondary | ICD-10-CM | POA: Diagnosis present

## 2024-04-06 DIAGNOSIS — M25512 Pain in left shoulder: Secondary | ICD-10-CM | POA: Diagnosis present

## 2024-04-06 DIAGNOSIS — M25511 Pain in right shoulder: Secondary | ICD-10-CM | POA: Diagnosis present

## 2024-04-06 DIAGNOSIS — M25612 Stiffness of left shoulder, not elsewhere classified: Secondary | ICD-10-CM

## 2024-04-06 NOTE — Therapy (Signed)
 " OUTPATIENT PHYSICAL THERAPY TREATMENT   Patient Name: Chelsea Nash MRN: [id number] DOB:[birth year], 49 y.o., female Today's Date: 04/06/2024  END OF SESSION:  PT End of Session - 04/06/24 0815     Visit Number 5    Number of Visits 17    Date for Recertification  04/27/24    PT Start Time 0815    PT Stop Time 0900    PT Time Calculation (min) 45 min    Activity Tolerance Patient tolerated treatment well    Behavior During Therapy WFL for tasks assessed/performed            Past Medical History:  Diagnosis Date   Asthma    Diabetes mellitus without complication (HCC)    Past Surgical History:  Procedure Laterality Date   adhesion removal after c-sections     CESAREAN SECTION     x2   TUBAL LIGATION     Patient Active Problem List   Diagnosis Date Noted   Depression, major, single episode, moderate (HCC) 03/12/2024   Vaginal candidiasis 03/12/2024   Primary insomnia 03/12/2024   Type 2 diabetes mellitus with diabetic neuropathy, with long-term current use of insulin (HCC) 09/27/2023   Hypertension associated with diabetes (HCC) 09/27/2023   Hyperlipidemia associated with type 2 diabetes mellitus (HCC) 09/27/2023   Mild intermittent asthma without complication 09/27/2023   Neuropathy of both feet 09/27/2023   Numbness of foot 09/09/2021    PCP: Sharma Coyer, MD   REFERRING PROVIDER: Kathlynn Sharper, MD  REFERRING DIAG: M75.01,M75.02 (ICD-10-CM) - Bilateral adhesive capsulitis of shoulders  THERAPY DIAG:  Right shoulder pain, unspecified chronicity  Left shoulder pain, unspecified chronicity  Stiffness of left shoulder, not elsewhere classified  Stiffness of right shoulder, not elsewhere classified  Rationale for Evaluation and Treatment: Rehabilitation  ONSET DATE: November 2025  SUBJECTIVE:                                                                                                                                                                                                          SUBJECTIVE STATEMENT: R shoulder is ok. L shoulder keeps hurting. Both shoulders bother her when she lays down on her sides, on her stomach.  R shoulder 5/10 currently, L shoulder 9/10 currently. Bringing her arms up to the side (abduction, shoulder pops.)    Hand dominance: Right  PERTINENT HISTORY:  B shoulder pain. Both shoulder hurt equally. Pain began the beginning of November 2025, gradual onset. Unknown mechanism of injury. Pt also adds that she lifts her grandchildren (2 and 1  year olds who are heavy for her). R started hurting more than the left initially but now both shoulder bother her equally. Pain has worsened since onset. Dr. 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     Blood pressure is controlled per pt.  No latex allergies.    PAIN:  Are you having pain? Yes: NPRS scale: 8/10 currently Pain location: B anterior arms, just below the shoulder joint.  Pain description: sore Aggravating factors: reaching up, behind her back, behind her head, laying on her R and L sides. Relieving factors: using other hand to help the hurting arm down when she raises it up too high.    PRECAUTIONS: no known precautions  RED FLAGS: Cervical red flags: Dysphagia No, Dysmetria No, Diplopia No, Nystagmus No, and Nausea No, Bowel or bladder incontinence: No, and Cauda equina syndrome: No     WEIGHT BEARING RESTRICTIONS: No  FALLS:  Has patient fallen in last 6 months? No  LIVING ENVIRONMENT: Lives with: lives with their daughter Lives in: House/apartment Stairs: No Has following equipment at home: None  OCCUPATION: Part time with home health.   PLOF: Independent  PATIENT GOALS: Make shoulders feel better.   NEXT MD VISIT: none scheduled yet  OBJECTIVE:  Note: Objective measures were completed at Evaluation unless otherwise noted.  DIAGNOSTIC FINDINGS:    PATIENT SURVEYS:  UEFS  Extreme  difficulty/unable (0), Quite a bit of difficulty (1), Moderate difficulty (2), Little difficulty (3), No difficulty (4) Survey date:  03/01/2024  Any of your usual work, household or school activities 1  2. Your usual hobbies, recreational/sport activities 1   3. Lifting a bag of groceries to waist level 0   4. Lifting a bag of groceries above your head 0  5. Grooming your hair 1  6. Pushing up on your hands (I.e. from bathtub or chair) 1  7. Preparing food (I.e. peeling/cutting) 3  8. Driving  3  9. Vacuuming, sweeping, or raking 2  10. Dressing  3  11. Doing up buttons 3  12. Using tools/appliances 0  13. Opening doors 2  14. Cleaning  2  15. Tying or lacing shoes 0  16. Sleeping  0  17. Laundering clothes (I.e. washing, ironing, folding) 3  18. Opening a jar 3  19. Throwing a ball 0  20. Carrying a small suitcase with your affected limb.  0  Score total:  28/80     COGNITION: Overall cognitive status: Within functional limits for tasks assessed  SENSATION: Tinging at tips of L 3-4 digits of L hand.  Tingling R and L anterior arm   POSTURE: slightly forward neck, R shoulder lower, R scapular protraction compared to L  PALPATION: TTP R and L subacromial area (anterior and lateral), R and L anterior arm  Muscle tension B upper trap, B cervical paraspinal muscles  L shoulder joint: decreased inferior and posterior joint mobility  Feels good with L posterior shoulder joint mobilization   R shoulder joint: decreased inferior and posterior joint mobility       CERVICAL ROM:   Active ROM A/PROM (deg) eval  Flexion Full with B posterior shoulder pulling  Extension WFL with B anterior lateral neck pain  Right lateral flexion WFL with L lateral neck pull  Left lateral flexion WFL  Right rotation [facility] with R inferior lateral neck pain  Left rotation WFL with L inferior lateral neck pain   (Blank rows = not tested)  UPPER EXTREMITY  ROM:  Active ROM Right eval  Left eval  Shoulder flexion 76 (93 AAROM with stiff end feel, anterior shoulder pain) 56 (60 AAROM, empty to stiff end feel, L lateral proximal arm pain)  Shoulder extension    Shoulder abduction 78 (86 AAROM, stiff and painful end feel) 40 (50 degrees AAROM with stiff and painful end feel); L shoulder shrug and R trunk side bend compensation  Shoulder adduction    Shoulder extension    Shoulder internal rotation 55 stiff and painful end feel (At 32 deg abduction)   88, stiff end feel  Shoulder external rotation 82 stiff and painful end feel At 32 deg abduction)  74 (stiff and painful end feel)  Elbow flexion    Elbow extension    Wrist flexion    Wrist extension    Wrist ulnar deviation    Wrist radial deviation    Wrist pronation    Wrist supination     (Blank rows = not tested)  UPPER EXTREMITY MMT:  MMT Right eval Left eval  Shoulder flexion 4 4  Shoulder extension    Shoulder abduction 4 4-  Shoulder adduction    Shoulder extension    Shoulder internal rotation 4- 4-  Shoulder external rotation 4- 4  Middle trapezius    Lower trapezius (seated manually resisted)  4- 3+  Elbow flexion    Elbow extension    Wrist flexion    Wrist extension    Wrist ulnar deviation    Wrist radial deviation    Wrist pronation    Wrist supination    Grip strength     (Blank rows = not tested)  CERVICAL SPECIAL TESTS:    FUNCTIONAL TESTS:    TREATMENT DATE: 04/06/2024                                                                                                                               Manual therapy  Supine with L arm in abduction   Posterior and inferior glide L humeral head grade 3- to 3 to improve mobility  Seated STM B cervical paraspinal and upper trap muscles to decrease tension   No L shoulder pain afterwards   Neuromuscular re education.   Supine with L arm propped on pillow and towel roll  Manually resisted L triceps extension isometrices 10x5  seconds for 2 sets  L shoulder ER (elbow in 90 degrees flexion ) isometrics 10x5 secodns for 2 sets    Then both triceps extension and ER simultaniously 10x5 seconds   L anterior shoulder and arm discomfort at 8th repetition.   Sitting with upright posture and B scapular retraction  With slight manual joint long axis distraction by PT    L shoulder ER isometrics 3x5 seconds    R upper trap sensation reported, stops with rest.    Seated chin tuck 10x2  B Upper trap muscle tension reported, and R anterior shoulder discomofort.  Seated B scapular retraction 10x5 seconds   Improved exercise technique, movement at target joints, use of target muscles after mod verbal, visual, tactile cues.      PATIENT EDUCATION:  Education details: There-ex, HEP Person educated: Patient Education method: Explanation, Demonstration, Tactile cues, Verbal cues, and Handouts Education comprehension: verbalized understanding and returned demonstration  HOME EXERCISE PROGRAM: Access Code: [email or web address] URL: [email or web address] Date: 04/06/2024 Prepared by: Emil Glassman  Exercises - Supine Scapular Retraction  - 2 x daily - 7 x weekly - 3 sets - 10 reps - 5 seconds hold - Seated Scapular Retraction  - 3 x daily - 7 x weekly - 3 sets - 10 reps - 5 seconds hold    ASSESSMENT:  CLINICAL IMPRESSION: Worked on promoting posterior and inferior glide of L humeral head to decrease anterior L shoulder joint impingement. Worked on decreasing muscle tension to B posterior cervical paraspinal (possible cervical nerve involvement with pain) and B upper trap muscles R > L to decrease stress to neck and B anterior shoulders and to promote better scapular mechanics. No B shoulder pain reported after session. Pt will benefit from continued skilled physical therapy services to decrease pain, improve strength and function.       OBJECTIVE IMPAIRMENTS: decreased ROM, decreased strength, impaired UE  functional use, improper body mechanics, postural dysfunction, and pain.   ACTIVITY LIMITATIONS: carrying, lifting, sleeping, bed mobility, bathing, toileting, dressing, reach over head, and hygiene/grooming  PARTICIPATION LIMITATIONS:   PERSONAL FACTORS: Fitness, Profession, and 1 comorbidity: DM are also affecting patient's functional outcome.   REHAB POTENTIAL: Fair    CLINICAL DECISION MAKING: Stable/uncomplicated  EVALUATION COMPLEXITY: Low   GOALS: Goals reviewed with patient? Yes  SHORT TERM GOALS: Target date: 03/16/2024  Pt will be independent with her initial HEP to decrease pain, improve ROM, strength, function, and ability to reach and lay on her sides more comfortably.  Baseline: Pt has not yet started her initial HEP (03/01/2024); no questions (03/26/2024) Goal status: MET     LONG TERM GOALS: Target date: 04/27/2024  Pt will have a decrease in B shoulder pain to 3/10 or less at worst to promote ability to reach, as well as lay on her R and L sides for sleep more comfortably.  Baseline: R anterior shoulder and arm 8/10, L anterior shoulder and arm 9/10 at most for the past 3 months (03/01/2024) Goal status: INITIAL  2.  Pt will improve B shoulder flexion and abduction AROM by at least 30 degrees to promote ability to reach more comfortably.  Baseline:  Active ROM Right eval Left eval  Shoulder flexion 76 (93 AAROM with stiff end feel, anterior shoulder pain) 56 (60 AAROM, empty to stiff end feel, L lateral proximal arm pain)  Shoulder abduction 78 (86 AAROM, stiff and painful end feel) 40 (50 degrees AAROM with stiff and painful end feel); L shoulder shrug and R trunk side bend compensation   Goal status: INITIAL  3.  Pt will improve her R shoulder ER and R and L shoulder IR AROM (at at least 75 degrees abduction) by at least 10 degrees to promote ability to reach behind her back and head more comfortably.  Baseline:  Active ROM Right eval Left eval   Shoulder internal rotation 55 stiff and painful end feel (At 32 deg abduction)   88, stiff end feel  Shoulder external rotation 82 stiff and painful end feel At 32 deg abduction)  74 (stiff and painful end feel)   Goal  status: INITIAL  4.  Pt will improve her UEFI score by at least 10 points as a demonstration of improved function.  Baseline: 28/80 (03/01/2024) Goal status: INITIAL    PLAN:  PT FREQUENCY: 1-2x/week  PT DURATION: 8 weeks  PLANNED INTERVENTIONS: 97110-Therapeutic exercises, 97530- Therapeutic activity, 97112- Neuromuscular re-education, 97535- Self Care, [id number]- Manual therapy, [id number]- Electrical stimulation (unattended), [phone]- Ionotophoresis 4mg /ml Dexamethasone, [id number] (1-2 muscles), 20561 (3+ muscles)- Dry Needling, Patient/Family education, Joint mobilization, and Spinal mobilization  PLAN FOR NEXT SESSION:  Manual techniques such as joint mobilization; thoracic extension, scapular and ER muscle strengthening, modalities PRN  Emil Glassman PT, DPT  Physical Therapist - [facility]  [facility]  04/06/2024, 9:23 AM     "

## 2024-04-10 ENCOUNTER — Ambulatory Visit

## 2024-04-11 ENCOUNTER — Ambulatory Visit

## 2024-04-11 DIAGNOSIS — M25611 Stiffness of right shoulder, not elsewhere classified: Secondary | ICD-10-CM

## 2024-04-11 DIAGNOSIS — M25511 Pain in right shoulder: Secondary | ICD-10-CM | POA: Diagnosis not present

## 2024-04-11 DIAGNOSIS — M25512 Pain in left shoulder: Secondary | ICD-10-CM

## 2024-04-11 DIAGNOSIS — M25612 Stiffness of left shoulder, not elsewhere classified: Secondary | ICD-10-CM

## 2024-04-11 NOTE — Therapy (Signed)
 " OUTPATIENT PHYSICAL THERAPY TREATMENT   Patient Name: Chelsea Nash MRN: [id number] DOB:07/06/1975, 49 y.o., female Today's Date: 04/11/2024  END OF SESSION:  PT End of Session - 04/11/24 1816     Visit Number 6    Number of Visits 17    Date for Recertification  04/27/24    Authorization Type CC [id number] for 12  PT vsts from    Authorization Time Period 12/11-1/20/26    Authorization - Visit Number 5    Authorization - Number of Visits 12    Progress Note Due on Visit 10    PT Start Time 1820    PT Stop Time 1900    PT Time Calculation (min) 40 min    Activity Tolerance Patient tolerated treatment well    Behavior During Therapy WFL for tasks assessed/performed            Past Medical History:  Diagnosis Date   Asthma    Diabetes mellitus without complication (HCC)    Past Surgical History:  Procedure Laterality Date   adhesion removal after c-sections     CESAREAN SECTION     x2   TUBAL LIGATION     Patient Active Problem List   Diagnosis Date Noted   Depression, major, single episode, moderate (HCC) 03/12/2024   Vaginal candidiasis 03/12/2024   Primary insomnia 03/12/2024   Type 2 diabetes mellitus with diabetic neuropathy, with long-term current use of insulin (HCC) 09/27/2023   Hypertension associated with diabetes (HCC) 09/27/2023   Hyperlipidemia associated with type 2 diabetes mellitus (HCC) 09/27/2023   Mild intermittent asthma without complication 09/27/2023   Neuropathy of both feet 09/27/2023   Numbness of foot 09/09/2021    PCP: Sharma Coyer, MD   REFERRING PROVIDER: Kathlynn Sharper, MD  REFERRING DIAG: M75.01,M75.02 (ICD-10-CM) - Bilateral adhesive capsulitis of shoulders  THERAPY DIAG:  Right shoulder pain, unspecified chronicity  Stiffness of right shoulder, not elsewhere classified  Left shoulder pain, unspecified chronicity  Stiffness of left shoulder, not elsewhere classified  Rationale for Evaluation and  Treatment: Rehabilitation  ONSET DATE: November 2025  SUBJECTIVE:                                                                                                                                                                                                         SUBJECTIVE STATEMENT: R shoulder is ok. L shoulder keeps hurting. Both shoulders bother her when she lays down on her sides, on her stomach.  R shoulder 5/10 currently, L shoulder 9/10 currently. Bringing her arms up to  the side (abduction, shoulder pops.)    Hand dominance: Right  PERTINENT HISTORY:  B shoulder pain. Both shoulder hurt equally. Pain began the beginning of November 2025, gradual onset. Unknown mechanism of injury. Pt also adds that she lifts her grandchildren (2 and 1 year olds who are heavy for her). R started hurting more than the left initially but now both shoulder bother her equally. Pain has worsened since onset. Dr. 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     Blood pressure is controlled per pt.  No latex allergies.    PAIN:  Are you having pain? Yes: NPRS scale: 8/10 currently Pain location: B anterior arms, just below the shoulder joint.  Pain description: sore Aggravating factors: reaching up, behind her back, behind her head, laying on her R and L sides. Relieving factors: using other hand to help the hurting arm down when she raises it up too high.    PRECAUTIONS: no known precautions  RED FLAGS: Cervical red flags: Dysphagia No, Dysmetria No, Diplopia No, Nystagmus No, and Nausea No, Bowel or bladder incontinence: No, and Cauda equina syndrome: No     WEIGHT BEARING RESTRICTIONS: No  FALLS:  Has patient fallen in last 6 months? No  LIVING ENVIRONMENT: Lives with: lives with their daughter Lives in: House/apartment Stairs: No Has following equipment at home: None  OCCUPATION: Part time with home health.   PLOF: Independent  PATIENT GOALS: Make shoulders feel  better.   NEXT MD VISIT: none scheduled yet  OBJECTIVE:  Note: Objective measures were completed at Evaluation unless otherwise noted.  DIAGNOSTIC FINDINGS:    PATIENT SURVEYS:  UEFS  Extreme difficulty/unable (0), Quite a bit of difficulty (1), Moderate difficulty (2), Little difficulty (3), No difficulty (4) Survey date:  03/01/2024  Any of your usual work, household or school activities 1  2. Your usual hobbies, recreational/sport activities 1   3. Lifting a bag of groceries to waist level 0   4. Lifting a bag of groceries above your head 0  5. Grooming your hair 1  6. Pushing up on your hands (I.e. from bathtub or chair) 1  7. Preparing food (I.e. peeling/cutting) 3  8. Driving  3  9. Vacuuming, sweeping, or raking 2  10. Dressing  3  11. Doing up buttons 3  12. Using tools/appliances 0  13. Opening doors 2  14. Cleaning  2  15. Tying or lacing shoes 0  16. Sleeping  0  17. Laundering clothes (I.e. washing, ironing, folding) 3  18. Opening a jar 3  19. Throwing a ball 0  20. Carrying a small suitcase with your affected limb.  0  Score total:  28/80     COGNITION: Overall cognitive status: Within functional limits for tasks assessed  SENSATION: Tinging at tips of L 3-4 digits of L hand.  Tingling R and L anterior arm   POSTURE: slightly forward neck, R shoulder lower, R scapular protraction compared to L  PALPATION: TTP R and L subacromial area (anterior and lateral), R and L anterior arm  Muscle tension B upper trap, B cervical paraspinal muscles  L shoulder joint: decreased inferior and posterior joint mobility  Feels good with L posterior shoulder joint mobilization   R shoulder joint: decreased inferior and posterior joint mobility       CERVICAL ROM:   Active ROM A/PROM (deg) eval  Flexion Full with B posterior shoulder pulling  Extension WFL with B anterior lateral neck  pain  Right lateral flexion WFL with L lateral neck pull  Left lateral  flexion WFL  Right rotation [facility] with R inferior lateral neck pain  Left rotation WFL with L inferior lateral neck pain   (Blank rows = not tested)  UPPER EXTREMITY ROM:  Active ROM Right eval Left eval  Shoulder flexion 76 (93 AAROM with stiff end feel, anterior shoulder pain) 56 (60 AAROM, empty to stiff end feel, L lateral proximal arm pain)  Shoulder extension    Shoulder abduction 78 (86 AAROM, stiff and painful end feel) 40 (50 degrees AAROM with stiff and painful end feel); L shoulder shrug and R trunk side bend compensation  Shoulder adduction    Shoulder extension    Shoulder internal rotation 55 stiff and painful end feel (At 32 deg abduction)   88, stiff end feel  Shoulder external rotation 82 stiff and painful end feel At 32 deg abduction)  74 (stiff and painful end feel)  Elbow flexion    Elbow extension    Wrist flexion    Wrist extension    Wrist ulnar deviation    Wrist radial deviation    Wrist pronation    Wrist supination     (Blank rows = not tested)  UPPER EXTREMITY MMT:  MMT Right eval Left eval  Shoulder flexion 4 4  Shoulder extension    Shoulder abduction 4 4-  Shoulder adduction    Shoulder extension    Shoulder internal rotation 4- 4-  Shoulder external rotation 4- 4  Middle trapezius    Lower trapezius (seated manually resisted)  4- 3+  Elbow flexion    Elbow extension    Wrist flexion    Wrist extension    Wrist ulnar deviation    Wrist radial deviation    Wrist pronation    Wrist supination    Grip strength     (Blank rows = not tested)  CERVICAL SPECIAL TESTS:    FUNCTIONAL TESTS:   Information above represents examination and objective data collected at Initial evaluation.   TREATMENT DATE: 04/11/24  Subjective: Patient reports 8/10 NPS in bilateral shoulders. Patient reports adherence to the HEP however she reports that it feels worse. No further questions or concerns.                                                                                       Therapeutic Exercise:   Seated Shoulder External Rotation with scapular retraction    1 x 10 - YTB 2 x 10 - Red TB   - VC for proper scapular retraction    Sidelying Horizontal Abduction    R/L: 3 x 10 - 2# DB, 1 x 10 - 3# DB (RUE only)    Sidelying GHJ ER    R/L: 3 x 10 - 2# DB ea arm - VC for proper form and keeping elbow neutral   Therapeutic Activity (focused on improving lifting and OH reaching muscle strength/endurance for ADLs and work related tasks):  UBE - 5 min - 2.5 min Fwd, 2.5 min Retro - Level 12-5 for UE  strength and muscular endurance; PT manually adjusted resistance  throughout to patient's tolerance.   Hooklying Shoulder Flexion with Dowel for improved OH reaching  1 x 10 - AROM  3 x 10 - YTB attacehed in the middle for resistance   Manual Therapy (with intent to improve muscular tension and decrease pain from GHJ impingement):  Light STM applied to upper trapezius and cervical paraspinal musculature bilaterally for pain modulation and decrease muscle tension. Myofascial release, trigger point and contract and relax techniques utilized. Pt endorsed improvements in pain following intervention.   Right & Left GHJ P-A Mobilization   30s/bout x 3 bouts x Grade III   Right and L GHJ Inf Mobilization   30s/bout x 3 bouts x Grade III    PATIENT EDUCATION:  Education details: There-ex, HEP Person educated: Patient Education method: Explanation, Demonstration, Tactile cues, Verbal cues, and Handouts Education comprehension: verbalized understanding and returned demonstration  HOME EXERCISE PROGRAM: Access Code: [email or web address] URL: [email or web address] Date: 04/06/2024 Prepared by: Emil Glassman  Exercises - Supine Scapular Retraction  - 2 x daily - 7 x weekly - 3 sets - 10 reps - 5 seconds hold - Seated Scapular Retraction  - 3 x daily - 7 x weekly - 3 sets - 10 reps - 5 seconds hold    ASSESSMENT:  CLINICAL  IMPRESSION: Continued PT POC focused on shoulder strengthening. Good participation with all PT interventions; pt tolerated all activities with minimum ( < 3 NPS) to no pain. Manual techniques utilized to decrease anterior shoulder joint impingement bilaterally; patient endorsed improvements in pain following interventions. Began OH deltoid strengthening in gravity eliminated positions and rotator cuff strength with light resistance. Patient endorsed improvements in subjective pain at end of session (3/10 NPS). Patient still presenting with significant deficits in R/L GHJ AROM ( < 120 deg) in abduction and flexion due to pain. Her symptoms currently limit her full participation with ADLs and playing with her grandchildren. Pt will benefit from continued skilled physical therapy services to decrease pain, improve strength and function.   OBJECTIVE IMPAIRMENTS: decreased ROM, decreased strength, impaired UE functional use, improper body mechanics, postural dysfunction, and pain.   ACTIVITY LIMITATIONS: carrying, lifting, sleeping, bed mobility, bathing, toileting, dressing, reach over head, and hygiene/grooming  PARTICIPATION LIMITATIONS:   PERSONAL FACTORS: Fitness, Profession, and 1 comorbidity: DM are also affecting patient's functional outcome.   REHAB POTENTIAL: Fair    CLINICAL DECISION MAKING: Stable/uncomplicated  EVALUATION COMPLEXITY: Low   GOALS: Goals reviewed with patient? Yes  SHORT TERM GOALS: Target date: 03/16/2024  Pt will be independent with her initial HEP to decrease pain, improve ROM, strength, function, and ability to reach and lay on her sides more comfortably.  Baseline: Pt has not yet started her initial HEP (03/01/2024); no questions (03/26/2024) Goal status: MET     LONG TERM GOALS: Target date: 04/27/2024  Pt will have a decrease in B shoulder pain to 3/10 or less at worst to promote ability to reach, as well as lay on her R and L sides for sleep more  comfortably.  Baseline: R anterior shoulder and arm 8/10, L anterior shoulder and arm 9/10 at most for the past 3 months (03/01/2024) Goal status: INITIAL  2.  Pt will improve B shoulder flexion and abduction AROM by at least 30 degrees to promote ability to reach more comfortably.  Baseline:  Active ROM Right eval Left eval  Shoulder flexion 76 (93 AAROM with stiff end feel, anterior shoulder pain) 56 (60 AAROM, empty to stiff end feel, L lateral proximal arm  pain)  Shoulder abduction 78 (86 AAROM, stiff and painful end feel) 40 (50 degrees AAROM with stiff and painful end feel); L shoulder shrug and R trunk side bend compensation   Goal status: INITIAL  3.  Pt will improve her R shoulder ER and R and L shoulder IR AROM (at at least 75 degrees abduction) by at least 10 degrees to promote ability to reach behind her back and head more comfortably.  Baseline:  Active ROM Right eval Left eval  Shoulder internal rotation 55 stiff and painful end feel (At 32 deg abduction)   88, stiff end feel  Shoulder external rotation 82 stiff and painful end feel At 32 deg abduction)  74 (stiff and painful end feel)   Goal status: INITIAL  4.  Pt will improve her UEFI score by at least 10 points as a demonstration of improved function.  Baseline: 28/80 (03/01/2024) Goal status: INITIAL    PLAN:  PT FREQUENCY: 1-2x/week  PT DURATION: 8 weeks  PLANNED INTERVENTIONS: 97110-Therapeutic exercises, 97530- Therapeutic activity, 97112- Neuromuscular re-education, 97535- Self Care, [id number]- Manual therapy, [id number]- Electrical stimulation (unattended), [phone]- Ionotophoresis 4mg /ml Dexamethasone, 79439 (1-2 muscles), 20561 (3+ muscles)- Dry Needling, Patient/Family education, Joint mobilization, and Spinal mobilization  PLAN FOR NEXT SESSION:  Manual techniques such as joint mobilization; thoracic extension, scapular and ER muscle strengthening, modalities PRN  Lonni Pall PT, DPT Physical Therapist-  [facility]  04/11/24, 10:11 PM     "

## 2024-04-12 ENCOUNTER — Ambulatory Visit

## 2024-04-16 ENCOUNTER — Ambulatory Visit

## 2024-04-16 DIAGNOSIS — M25511 Pain in right shoulder: Secondary | ICD-10-CM

## 2024-04-16 DIAGNOSIS — M25512 Pain in left shoulder: Secondary | ICD-10-CM

## 2024-04-16 DIAGNOSIS — M25611 Stiffness of right shoulder, not elsewhere classified: Secondary | ICD-10-CM

## 2024-04-16 DIAGNOSIS — M25612 Stiffness of left shoulder, not elsewhere classified: Secondary | ICD-10-CM

## 2024-04-16 NOTE — Therapy (Signed)
 " OUTPATIENT PHYSICAL THERAPY TREATMENT   Patient Name: Chelsea Nash MRN: [id number] DOB:03-30-1975, 49 y.o., female Today's Date: 04/16/2024  END OF SESSION:  PT End of Session - 04/16/24 1732     Visit Number 7    Number of Visits 17    Date for Recertification  04/27/24    Authorization Type CC [id number] for 12  PT vsts from    Authorization Time Period 12/11-1/20/26    Authorization - Visit Number 7    Authorization - Number of Visits 12    Progress Note Due on Visit 10    PT Start Time 1730    PT Stop Time 1810    PT Time Calculation (min) 40 min    Activity Tolerance Patient tolerated treatment well    Behavior During Therapy WFL for tasks assessed/performed            Past Medical History:  Diagnosis Date   Asthma    Diabetes mellitus without complication (HCC)    Past Surgical History:  Procedure Laterality Date   adhesion removal after c-sections     CESAREAN SECTION     x2   TUBAL LIGATION     Patient Active Problem List   Diagnosis Date Noted   Depression, major, single episode, moderate (HCC) 03/12/2024   Vaginal candidiasis 03/12/2024   Primary insomnia 03/12/2024   Type 2 diabetes mellitus with diabetic neuropathy, with long-term current use of insulin (HCC) 09/27/2023   Hypertension associated with diabetes (HCC) 09/27/2023   Hyperlipidemia associated with type 2 diabetes mellitus (HCC) 09/27/2023   Mild intermittent asthma without complication 09/27/2023   Neuropathy of both feet 09/27/2023   Numbness of foot 09/09/2021    PCP: Sharma Coyer, MD   REFERRING PROVIDER: Kathlynn Sharper, MD  REFERRING DIAG: M75.01,M75.02 (ICD-10-CM) - Bilateral adhesive capsulitis of shoulders  THERAPY DIAG:  Right shoulder pain, unspecified chronicity  Stiffness of right shoulder, not elsewhere classified  Left shoulder pain, unspecified chronicity  Stiffness of left shoulder, not elsewhere classified  Rationale for Evaluation and  Treatment: Rehabilitation  ONSET DATE: November 2025  SUBJECTIVE:                                                                                                                                                                                                         SUBJECTIVE STATEMENT: R shoulder is ok. L shoulder keeps hurting. Both shoulders bother her when she lays down on her sides, on her stomach.  R shoulder 5/10 currently, L shoulder 9/10 currently. Bringing her arms up to  the side (abduction, shoulder pops.)    Hand dominance: Right  PERTINENT HISTORY:  B shoulder pain. Both shoulder hurt equally. Pain began the beginning of November 2025, gradual onset. Unknown mechanism of injury. Pt also adds that she lifts her grandchildren (2 and 1 year olds who are heavy for her). R started hurting more than the left initially but now both shoulder bother her equally. Pain has worsened since onset. Dr. 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     Blood pressure is controlled per pt.  No latex allergies.    PAIN:  Are you having pain? Yes: NPRS scale: 8/10 currently Pain location: B anterior arms, just below the shoulder joint.  Pain description: sore Aggravating factors: reaching up, behind her back, behind her head, laying on her R and L sides. Relieving factors: using other hand to help the hurting arm down when she raises it up too high.    PRECAUTIONS: no known precautions  RED FLAGS: Cervical red flags: Dysphagia No, Dysmetria No, Diplopia No, Nystagmus No, and Nausea No, Bowel or bladder incontinence: No, and Cauda equina syndrome: No     WEIGHT BEARING RESTRICTIONS: No  FALLS:  Has patient fallen in last 6 months? No  LIVING ENVIRONMENT: Lives with: lives with their daughter Lives in: House/apartment Stairs: No Has following equipment at home: None  OCCUPATION: Part time with home health.   PLOF: Independent  PATIENT GOALS: Make shoulders feel  better.   NEXT MD VISIT: none scheduled yet  OBJECTIVE:  Note: Objective measures were completed at Evaluation unless otherwise noted.  DIAGNOSTIC FINDINGS:    PATIENT SURVEYS:  UEFS  Extreme difficulty/unable (0), Quite a bit of difficulty (1), Moderate difficulty (2), Little difficulty (3), No difficulty (4) Survey date:  03/01/2024  Any of your usual work, household or school activities 1  2. Your usual hobbies, recreational/sport activities 1   3. Lifting a bag of groceries to waist level 0   4. Lifting a bag of groceries above your head 0  5. Grooming your hair 1  6. Pushing up on your hands (I.e. from bathtub or chair) 1  7. Preparing food (I.e. peeling/cutting) 3  8. Driving  3  9. Vacuuming, sweeping, or raking 2  10. Dressing  3  11. Doing up buttons 3  12. Using tools/appliances 0  13. Opening doors 2  14. Cleaning  2  15. Tying or lacing shoes 0  16. Sleeping  0  17. Laundering clothes (I.e. washing, ironing, folding) 3  18. Opening a jar 3  19. Throwing a ball 0  20. Carrying a small suitcase with your affected limb.  0  Score total:  28/80     COGNITION: Overall cognitive status: Within functional limits for tasks assessed  SENSATION: Tinging at tips of L 3-4 digits of L hand.  Tingling R and L anterior arm   POSTURE: slightly forward neck, R shoulder lower, R scapular protraction compared to L  PALPATION: TTP R and L subacromial area (anterior and lateral), R and L anterior arm  Muscle tension B upper trap, B cervical paraspinal muscles  L shoulder joint: decreased inferior and posterior joint mobility  Feels good with L posterior shoulder joint mobilization   R shoulder joint: decreased inferior and posterior joint mobility       CERVICAL ROM:   Active ROM A/PROM (deg) eval  Flexion Full with B posterior shoulder pulling  Extension WFL with B anterior lateral neck  pain  Right lateral flexion WFL with L lateral neck pull  Left lateral  flexion WFL  Right rotation [facility] with R inferior lateral neck pain  Left rotation WFL with L inferior lateral neck pain   (Blank rows = not tested)  UPPER EXTREMITY ROM:  Active ROM Right eval Left eval  Shoulder flexion 76 (93 AAROM with stiff end feel, anterior shoulder pain) 56 (60 AAROM, empty to stiff end feel, L lateral proximal arm pain)  Shoulder extension    Shoulder abduction 78 (86 AAROM, stiff and painful end feel) 40 (50 degrees AAROM with stiff and painful end feel); L shoulder shrug and R trunk side bend compensation  Shoulder adduction    Shoulder extension    Shoulder internal rotation 55 stiff and painful end feel (At 32 deg abduction)   88, stiff end feel  Shoulder external rotation 82 stiff and painful end feel At 32 deg abduction)  74 (stiff and painful end feel)  Elbow flexion    Elbow extension    Wrist flexion    Wrist extension    Wrist ulnar deviation    Wrist radial deviation    Wrist pronation    Wrist supination     (Blank rows = not tested)  UPPER EXTREMITY MMT:  MMT Right eval Left eval  Shoulder flexion 4 4  Shoulder extension    Shoulder abduction 4 4-  Shoulder adduction    Shoulder extension    Shoulder internal rotation 4- 4-  Shoulder external rotation 4- 4  Middle trapezius    Lower trapezius (seated manually resisted)  4- 3+  Elbow flexion    Elbow extension    Wrist flexion    Wrist extension    Wrist ulnar deviation    Wrist radial deviation    Wrist pronation    Wrist supination    Grip strength     (Blank rows = not tested)  CERVICAL SPECIAL TESTS:    FUNCTIONAL TESTS:   Information above represents examination and objective data collected at Initial evaluation.   TREATMENT DATE: 04/16/24  Subjective: Patient reports 7-8/10 NPS in bilateral shoulders. She reports that when she lays on her shoulders they feel stiff. Her pain has made it difficult to sleep and achieve < 5 hours of sleep. She performs her HEP  however it gives her a popping sensation in her shoulders. No further questions or concerns.                                                                             Therapeutic Exercise:   Seated Shoulder Row    1 x 10 - 10#   2 x 10 - 20#  Seated Shoulder External Rotation with scapular retraction    2 x 10 - Red TB    Standing Horizontal Abduction   3 x 10 - Red TB   Sidelying GHJ ER    R/L: 3 x 10 - 2# DB ea arm - multimodal cues for neutral elbow    Supine Shoulder Flexion against resistance    2 x 10 - Red TB   Therapeutic Activity (focused on improving lifting and OH reaching muscle strength/endurance for ADLs and work  related tasks):  UBE - 5 min - 2.5 min Fwd, 2.5 min Retro - Level 10-5 for UE  strength and muscular endurance; PT manually adjusted resistance throughout to patient's tolerance.   Hooklying Shoulder Flexion with Dowel for improved OH reaching  2 x 10 - AROM  Hooklying Shoulder Flexion with DB   1 x 10 - 3# DB     Manual Therapy (with intent to improve muscular tension and decrease pain from GHJ impingement):  Light STM applied to upper trapezius and cervical paraspinal, posterior and lateral deltoid musculature bilaterally for pain modulation and decrease muscle tension. Myofascial release, trigger point and contract and relax techniques utilized. Pt endorsed improvements in pain following intervention.   Right & Left GHJ P-A Mobilization   60s/bout x 3 bouts x Grade III   Right and L GHJ Inf Mobilization   60s/bout x 3 bouts x Grade III    PATIENT EDUCATION:  Education details: There-ex, HEP Person educated: Patient Education method: Explanation, Demonstration, Tactile cues, Verbal cues, and Handouts Education comprehension: verbalized understanding and returned demonstration  HOME EXERCISE PROGRAM: Access Code: [email or web address] URL: [email or web address] Date: 04/16/2024 Prepared by: Lonni Pall  Exercises - Supine Scapular  Retraction  - 2 x daily - 7 x weekly - 3 sets - 10 reps - 5 seconds hold - Seated Scapular Retraction  - 3 x daily - 7 x weekly - 3 sets - 10 reps - 5 seconds hold - Sidelying Shoulder ER with Towel and Dumbbell  - 1 x daily - 3-4 x weekly - 2-3 sets - 10-15 reps - Standing Shoulder Row with Anchored Resistance  - 1 x daily - 3-4 x weekly - 2-3 sets - 10-15 reps - Shoulder External Rotation and Scapular Retraction with Resistance  - 1 x daily - 3-4 x weekly - 2-3 sets - 10-15 reps - Supine Shoulder Flexion Extension AAROM with Dowel  - 1 x daily - 7 x weekly - 2-3 sets - 10-12 reps  Access Code: [email or web address] URL: [email or web address] Date: 04/06/2024 Prepared by: Emil Glassman  Exercises - Supine Scapular Retraction  - 2 x daily - 7 x weekly - 3 sets - 10 reps - 5 seconds hold - Seated Scapular Retraction  - 3 x daily - 7 x weekly - 3 sets - 10 reps - 5 seconds hold    ASSESSMENT:  CLINICAL IMPRESSION: Continued PT POC focused on shoulder strengthening. Session focused on progressive strengthening for deltoid muscles and rotator cuff group bilaterally. Good participation with all PT interventions without exacerbation of pain to either shoulder. Throughout the session she endorsed popping sensation with relief both shoulders. Continued shoulder strengthening in gravity eliminated positions in order to decrease pain with AROM. Patient still presenting with significant deficits in R/L GHJ AROM ( < 120 deg) in abduction and flexion due to pain. Her symptoms currently limit her full participation with ADLs and playing with her grandchildren. PT strongly recommends continued skilled physical therapy in order to improve UE strength, ROM and pain for improved QoL.   OBJECTIVE IMPAIRMENTS: decreased ROM, decreased strength, impaired UE functional use, improper body mechanics, postural dysfunction, and pain.   ACTIVITY LIMITATIONS: carrying, lifting, sleeping, bed mobility, bathing,  toileting, dressing, reach over head, and hygiene/grooming  PARTICIPATION LIMITATIONS:   PERSONAL FACTORS: Fitness, Profession, and 1 comorbidity: DM are also affecting patient's functional outcome.   REHAB POTENTIAL: Fair    CLINICAL DECISION MAKING: Stable/uncomplicated  EVALUATION COMPLEXITY: Low   GOALS: Goals reviewed with patient? Yes  SHORT TERM GOALS: Target date: 03/16/2024  Pt will be independent with her initial HEP to decrease pain, improve ROM, strength, function, and ability to reach and lay on her sides more comfortably.  Baseline: Pt has not yet started her initial HEP (03/01/2024); no questions (03/26/2024) Goal status: MET     LONG TERM GOALS: Target date: 04/27/2024  Pt will have a decrease in B shoulder pain to 3/10 or less at worst to promote ability to reach, as well as lay on her R and L sides for sleep more comfortably.  Baseline: R anterior shoulder and arm 8/10, L anterior shoulder and arm 9/10 at most for the past 3 months (03/01/2024) Goal status: INITIAL  2.  Pt will improve B shoulder flexion and abduction AROM by at least 30 degrees to promote ability to reach more comfortably.  Baseline:  Active ROM Right eval Left eval  Shoulder flexion 76 (93 AAROM with stiff end feel, anterior shoulder pain) 56 (60 AAROM, empty to stiff end feel, L lateral proximal arm pain)  Shoulder abduction 78 (86 AAROM, stiff and painful end feel) 40 (50 degrees AAROM with stiff and painful end feel); L shoulder shrug and R trunk side bend compensation   Goal status: INITIAL  3.  Pt will improve her R shoulder ER and R and L shoulder IR AROM (at at least 75 degrees abduction) by at least 10 degrees to promote ability to reach behind her back and head more comfortably.  Baseline:  Active ROM Right eval Left eval  Shoulder internal rotation 55 stiff and painful end feel (At 32 deg abduction)   88, stiff end feel  Shoulder external rotation 82 stiff and painful end  feel At 32 deg abduction)  74 (stiff and painful end feel)   Goal status: INITIAL  4.  Pt will improve her UEFI score by at least 10 points as a demonstration of improved function.  Baseline: 28/80 (03/01/2024) Goal status: INITIAL    PLAN:  PT FREQUENCY: 1-2x/week  PT DURATION: 8 weeks  PLANNED INTERVENTIONS: 97110-Therapeutic exercises, 97530- Therapeutic activity, 97112- Neuromuscular re-education, 97535- Self Care, [id number]- Manual therapy, [id number]- Electrical stimulation (unattended), [phone]- Ionotophoresis 4mg /ml Dexamethasone, 79439 (1-2 muscles), 20561 (3+ muscles)- Dry Needling, Patient/Family education, Joint mobilization, and Spinal mobilization  PLAN FOR NEXT SESSION:  Manual techniques such as joint mobilization; thoracic extension, scapular and ER muscle strengthening, modalities PRN  Lonni Pall PT, DPT Physical Therapist- [facility]  04/16/24, 5:35 PM     "

## 2024-04-17 ENCOUNTER — Ambulatory Visit

## 2024-04-18 ENCOUNTER — Ambulatory Visit

## 2024-04-19 ENCOUNTER — Ambulatory Visit

## 2024-04-24 ENCOUNTER — Ambulatory Visit

## 2024-04-24 DIAGNOSIS — M25511 Pain in right shoulder: Secondary | ICD-10-CM | POA: Diagnosis not present

## 2024-04-24 DIAGNOSIS — M25611 Stiffness of right shoulder, not elsewhere classified: Secondary | ICD-10-CM

## 2024-04-24 DIAGNOSIS — M25512 Pain in left shoulder: Secondary | ICD-10-CM

## 2024-04-24 NOTE — Therapy (Signed)
 " OUTPATIENT PHYSICAL THERAPY TREATMENT   Patient Name: Chelsea Nash MRN: [id number] DOB:05-Dec-1975, 49 y.o., female Today's Date: 04/24/2024  END OF SESSION:  PT End of Session - 04/24/24 1628     Visit Number 8    Number of Visits 17    Date for Recertification  04/27/24    Authorization Type CC [id number] for 2 PT vsts from    Authorization Time Period 1/27-1/30    Authorization - Visit Number 8    Authorization - Number of Visits 2    Progress Note Due on Visit 10    PT Start Time 1625    PT Stop Time 1705    PT Time Calculation (min) 40 min    Activity Tolerance Patient tolerated treatment well    Behavior During Therapy WFL for tasks assessed/performed            Past Medical History:  Diagnosis Date   Asthma    Diabetes mellitus without complication (HCC)    Past Surgical History:  Procedure Laterality Date   adhesion removal after c-sections     CESAREAN SECTION     x2   TUBAL LIGATION     Patient Active Problem List   Diagnosis Date Noted   Depression, major, single episode, moderate (HCC) 03/12/2024   Vaginal candidiasis 03/12/2024   Primary insomnia 03/12/2024   Type 2 diabetes mellitus with diabetic neuropathy, with long-term current use of insulin (HCC) 09/27/2023   Hypertension associated with diabetes (HCC) 09/27/2023   Hyperlipidemia associated with type 2 diabetes mellitus (HCC) 09/27/2023   Mild intermittent asthma without complication 09/27/2023   Neuropathy of both feet 09/27/2023   Numbness of foot 09/09/2021    PCP: Sharma Coyer, MD   REFERRING PROVIDER: Kathlynn Sharper, MD  REFERRING DIAG: M75.01,M75.02 (ICD-10-CM) - Bilateral adhesive capsulitis of shoulders  THERAPY DIAG:  Right shoulder pain, unspecified chronicity  Stiffness of right shoulder, not elsewhere classified  Left shoulder pain, unspecified chronicity  Rationale for Evaluation and Treatment: Rehabilitation  ONSET DATE: November  2025  SUBJECTIVE:                                                                                                                                                                                                         SUBJECTIVE STATEMENT: R shoulder is ok. L shoulder keeps hurting. Both shoulders bother her when she lays down on her sides, on her stomach.  R shoulder 5/10 currently, L shoulder 9/10 currently. Bringing her arms up to the side (abduction, shoulder pops.)    Hand  dominance: Right  PERTINENT HISTORY:  B shoulder pain. Both shoulder hurt equally. Pain began the beginning of November 2025, gradual onset. Unknown mechanism of injury. Pt also adds that she lifts her grandchildren (2 and 1 year olds who are heavy for her). R started hurting more than the left initially but now both shoulder bother her equally. Pain has worsened since onset. Dr. 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     Blood pressure is controlled per pt.  No latex allergies.    PAIN:  Are you having pain? Yes: NPRS scale: 8/10 currently Pain location: B anterior arms, just below the shoulder joint.  Pain description: sore Aggravating factors: reaching up, behind her back, behind her head, laying on her R and L sides. Relieving factors: using other hand to help the hurting arm down when she raises it up too high.    PRECAUTIONS: no known precautions  RED FLAGS: Cervical red flags: Dysphagia No, Dysmetria No, Diplopia No, Nystagmus No, and Nausea No, Bowel or bladder incontinence: No, and Cauda equina syndrome: No     WEIGHT BEARING RESTRICTIONS: No  FALLS:  Has patient fallen in last 6 months? No  LIVING ENVIRONMENT: Lives with: lives with their daughter Lives in: House/apartment Stairs: No Has following equipment at home: None  OCCUPATION: Part time with home health.   PLOF: Independent  PATIENT GOALS: Make shoulders feel better.   NEXT MD VISIT: none scheduled  yet  OBJECTIVE:  Note: Objective measures were completed at Evaluation unless otherwise noted.  DIAGNOSTIC FINDINGS:    PATIENT SURVEYS:  UEFS  Extreme difficulty/unable (0), Quite a bit of difficulty (1), Moderate difficulty (2), Little difficulty (3), No difficulty (4) Survey date:  03/01/2024  Any of your usual work, household or school activities 1  2. Your usual hobbies, recreational/sport activities 1   3. Lifting a bag of groceries to waist level 0   4. Lifting a bag of groceries above your head 0  5. Grooming your hair 1  6. Pushing up on your hands (I.e. from bathtub or chair) 1  7. Preparing food (I.e. peeling/cutting) 3  8. Driving  3  9. Vacuuming, sweeping, or raking 2  10. Dressing  3  11. Doing up buttons 3  12. Using tools/appliances 0  13. Opening doors 2  14. Cleaning  2  15. Tying or lacing shoes 0  16. Sleeping  0  17. Laundering clothes (I.e. washing, ironing, folding) 3  18. Opening a jar 3  19. Throwing a ball 0  20. Carrying a small suitcase with your affected limb.  0  Score total:  28/80     COGNITION: Overall cognitive status: Within functional limits for tasks assessed  SENSATION: Tinging at tips of L 3-4 digits of L hand.  Tingling R and L anterior arm   POSTURE: slightly forward neck, R shoulder lower, R scapular protraction compared to L  PALPATION: TTP R and L subacromial area (anterior and lateral), R and L anterior arm  Muscle tension B upper trap, B cervical paraspinal muscles  L shoulder joint: decreased inferior and posterior joint mobility  Feels good with L posterior shoulder joint mobilization   R shoulder joint: decreased inferior and posterior joint mobility       CERVICAL ROM:   Active ROM A/PROM (deg) eval  Flexion Full with B posterior shoulder pulling  Extension WFL with B anterior lateral neck pain  Right lateral flexion WFL with L lateral  neck pull  Left lateral flexion WFL  Right rotation [facility] with R  inferior lateral neck pain  Left rotation WFL with L inferior lateral neck pain   (Blank rows = not tested)  UPPER EXTREMITY ROM:  Active ROM Right eval Left eval  Shoulder flexion 76 (93 AAROM with stiff end feel, anterior shoulder pain) 56 (60 AAROM, empty to stiff end feel, L lateral proximal arm pain)  Shoulder extension    Shoulder abduction 78 (86 AAROM, stiff and painful end feel) 40 (50 degrees AAROM with stiff and painful end feel); L shoulder shrug and R trunk side bend compensation  Shoulder adduction    Shoulder extension    Shoulder internal rotation 55 stiff and painful end feel (At 32 deg abduction)   88, stiff end feel  Shoulder external rotation 82 stiff and painful end feel At 32 deg abduction)  74 (stiff and painful end feel)  Elbow flexion    Elbow extension    Wrist flexion    Wrist extension    Wrist ulnar deviation    Wrist radial deviation    Wrist pronation    Wrist supination     (Blank rows = not tested)  UPPER EXTREMITY MMT:  MMT Right eval Left eval  Shoulder flexion 4 4  Shoulder extension    Shoulder abduction 4 4-  Shoulder adduction    Shoulder extension    Shoulder internal rotation 4- 4-  Shoulder external rotation 4- 4  Middle trapezius    Lower trapezius (seated manually resisted)  4- 3+  Elbow flexion    Elbow extension    Wrist flexion    Wrist extension    Wrist ulnar deviation    Wrist radial deviation    Wrist pronation    Wrist supination    Grip strength     (Blank rows = not tested)  CERVICAL SPECIAL TESTS:    FUNCTIONAL TESTS:   Information above represents examination and objective data collected at Initial evaluation.   TREATMENT DATE: 04/24/24  Subjective: Patient reports 7/10 NPS in bilateral shoulders. She reports that when sleeps at night lays on her shoulders it increases the pain.  No further questions or concerns.                                                                              Therapeutic Exercise: UBE - 5 min - 2.5 min Fwd, 2.5 min Retro - Level 10-5 for UE  strength and muscular endurance; PT manually adjusted resistance throughout to patient's tolerance.    Seated Shoulder Row    1 x 10 - 20#    2 x 10 - 25#   Standing Posterior Delt Flys    3 x 10 - Red TB     Seated Shoulder ER    R/L: 1 x 10 - 3# DB    R/L: 1 x 10 - 2# DB    [place] Chest Press with weighted dowel    2 x 10 (6# dowel)    Manual Therapy (with intent to improve muscular tension and decrease pain from GHJ impingement):  Light STM applied to upper trapezius and cervical paraspinal, posterior and lateral deltoid musculature  bilaterally for pain modulation and decrease muscle tension. Myofascial release, trigger point and contract and relax techniques utilized. Pt endorsed improvements in pain following intervention.   Right & Left GHJ P-A Mobilization   60s/bout x 4 bouts x Grade III   Right and L GHJ Inf Mobilization   60s/bout x 4 bouts x Grade III   Right and Left GHJ Distraction Mobilization  60s/bout x 2 bouts ea UE   PATIENT EDUCATION:  Education details: There-ex, HEP Person educated: Patient Education method: Explanation, Demonstration, Tactile cues, Verbal cues, and Handouts Education comprehension: verbalized understanding and returned demonstration  HOME EXERCISE PROGRAM: Access Code: [email or web address] URL: [email or web address] Date: 04/16/2024 Prepared by: Lonni Pall  Exercises - Supine Scapular Retraction  - 2 x daily - 7 x weekly - 3 sets - 10 reps - 5 seconds hold - Seated Scapular Retraction  - 3 x daily - 7 x weekly - 3 sets - 10 reps - 5 seconds hold - Sidelying Shoulder ER with Towel and Dumbbell  - 1 x daily - 3-4 x weekly - 2-3 sets - 10-15 reps - Standing Shoulder Row with Anchored Resistance  - 1 x daily - 3-4 x weekly - 2-3 sets - 10-15 reps - Shoulder External Rotation and Scapular Retraction with Resistance  - 1 x daily - 3-4 x  weekly - 2-3 sets - 10-15 reps - Supine Shoulder Flexion Extension AAROM with Dowel  - 1 x daily - 7 x weekly - 2-3 sets - 10-12 reps  Access Code: [email or web address] URL: [email or web address] Date: 04/06/2024 Prepared by: Emil Glassman  Exercises - Supine Scapular Retraction  - 2 x daily - 7 x weekly - 3 sets - 10 reps - 5 seconds hold - Seated Scapular Retraction  - 3 x daily - 7 x weekly - 3 sets - 10 reps - 5 seconds hold    ASSESSMENT:  CLINICAL IMPRESSION: Continued PT POC focused on shoulder strengthening. Patient limited with PT exercises due to pain in the R/L Shoulder. Reassessed progress towards PT goals (see below at goals). Although her AROM has improved she still presents with moderate to severe pain limiting upward scapular reach > 120 deg. Patient with deficits in external and internal rotation limiting ability to reach behind her back.  Per subjective score on UEFI she has signifcantly improved with functional activities however she still reports significant pain with all of those activities. Manual techniques provided to decrease pain and addressed. GHJ mobilizations with capsular end feel with pain reported at end range; pain improved with additional bouts in posterior and inferior direction. Without PT interventions patient at risk of further reinjury of bilateral UE.  Based on today's performance the patient will benefit from continued skilled physical therapy in order to address her current deficits and maximize return to PLOF.       OBJECTIVE IMPAIRMENTS: decreased ROM, decreased strength, impaired UE functional use, improper body mechanics, postural dysfunction, and pain.   ACTIVITY LIMITATIONS: carrying, lifting, sleeping, bed mobility, bathing, toileting, dressing, reach over head, and hygiene/grooming  PARTICIPATION LIMITATIONS:   PERSONAL FACTORS: Fitness, Profession, and 1 comorbidity: DM are also affecting patient's functional outcome.   REHAB POTENTIAL:  Fair    CLINICAL DECISION MAKING: Stable/uncomplicated  EVALUATION COMPLEXITY: Low   GOALS: Goals reviewed with patient? Yes  SHORT TERM GOALS: Target date: 03/16/2024  Pt will be independent with her initial HEP to decrease pain, improve ROM, strength, function, and ability to reach and lay on her sides more comfortably.  Baseline: Pt has not yet started her initial HEP (03/01/2024); no questions (03/26/2024) Goal status: MET     LONG TERM GOALS: Target date: 04/27/2024  Pt will have a decrease in B shoulder pain to 3/10 or less at worst to promote ability to reach, as well as lay on her R and L sides for sleep more comfortably.  Baseline: R anterior shoulder and arm 8/10, L anterior shoulder and arm 9/10 at most for the past 3 months (03/01/2024); 04/24/2024: 9/10  Goal status: Progressing  2.  Pt will improve B shoulder flexion and abduction AROM by at least 30 degrees to promote ability to reach more comfortably.  Baseline:  Active ROM Right eval Left eval R 04/24/24 L : 04/25/2023  Shoulder flexion 76 (93 AAROM with stiff end feel, anterior shoulder pain) 56 (60 AAROM, empty to stiff end feel, L lateral proximal arm pain) AROM: 120 AROM: 95   Shoulder abduction 78 (86 AAROM, stiff and painful end feel) 40 (50 degrees AAROM with stiff and painful end feel); L shoulder shrug and R trunk side bend compensation AROM: 120 AROM: 110    Goal status: Goal Progressing  3.  Pt will improve her R shoulder ER and R and L shoulder IR AROM (at at least 75 degrees abduction) by at least 10 degrees to promote ability to reach behind her back and head more comfortably.  Baseline:  Active ROM Right eval Left eval Right 04/24/2024 At 90 deg abd Left 04/24/2024 At 90 deg abd  Shoulder internal rotation 55 stiff and painful end feel (At 32 deg abduction)   88, stiff end feel 80 deg  80 deg   Shoulder external rotation 82 stiff and painful end feel At 32 deg abduction)  74 (stiff and  painful end feel) 55 deg  40 deg   Goal status: Progressing  4.  Pt will improve her UEFI score by at least 10 points as a demonstration of improved function.  Baseline: 28/80 (03/01/2024); 60/80 (04/24/24) Goal status: Goal Met    PLAN:  PT FREQUENCY: 1-2x/week  PT DURATION: 8 weeks  PLANNED INTERVENTIONS: 97110-Therapeutic exercises, 97530- Therapeutic activity, 97112- Neuromuscular re-education, 97535- Self Care, [id number]- Manual therapy, [id number]- Electrical stimulation (unattended), [phone]- Ionotophoresis 4mg /ml Dexamethasone, [id number] (1-2 muscles), 20561 (3+ muscles)- Dry Needling, Patient/Family education, Joint mobilization, and Spinal mobilization  PLAN FOR NEXT SESSION:  Manual techniques such as joint mobilization; thoracic extension, scapular and ER muscle strengthening, modalities PRN  Lonni Pall PT, DPT Physical Therapist- [facility]  04/24/24, 5:16 PM     "

## 2024-04-26 ENCOUNTER — Ambulatory Visit

## 2024-04-26 NOTE — Therapy (Incomplete)
 " OUTPATIENT PHYSICAL THERAPY TREATMENT/RECERTIFICATION   Patient Name: Chelsea Nash MRN: [id number] DOB:12/06/1975, 49 y.o., female Today's Date: 04/26/2024  END OF SESSION:      Past Medical History:  Diagnosis Date   Asthma    Diabetes mellitus without complication (HCC)    Past Surgical History:  Procedure Laterality Date   adhesion removal after c-sections     CESAREAN SECTION     x2   TUBAL LIGATION     Patient Active Problem List   Diagnosis Date Noted   Depression, major, single episode, moderate (HCC) 03/12/2024   Vaginal candidiasis 03/12/2024   Primary insomnia 03/12/2024   Type 2 diabetes mellitus with diabetic neuropathy, with long-term current use of insulin (HCC) 09/27/2023   Hypertension associated with diabetes (HCC) 09/27/2023   Hyperlipidemia associated with type 2 diabetes mellitus (HCC) 09/27/2023   Mild intermittent asthma without complication 09/27/2023   Neuropathy of both feet 09/27/2023   Numbness of foot 09/09/2021    PCP: Sharma Coyer, MD   REFERRING PROVIDER: Kathlynn Sharper, MD  REFERRING DIAG: M75.01,M75.02 (ICD-10-CM) - Bilateral adhesive capsulitis of shoulders  THERAPY DIAG:  No diagnosis found.  Rationale for Evaluation and Treatment: Rehabilitation  ONSET DATE: November 2025  SUBJECTIVE:                                                                                                                                                                                                         SUBJECTIVE STATEMENT: R shoulder is ok. L shoulder keeps hurting. Both shoulders bother her when she lays down on her sides, on her stomach.  R shoulder 5/10 currently, L shoulder 9/10 currently. Bringing her arms up to the side (abduction, shoulder pops.)    Hand dominance: Right  PERTINENT HISTORY:  B shoulder pain. Both shoulder hurt equally. Pain began the beginning of November 2025, gradual onset. Unknown mechanism of  injury. Pt also adds that she lifts her grandchildren (2 and 1 year olds who are heavy for her). R started hurting more than the left initially but now both shoulder bother her equally. Pain has worsened since onset. Dr. 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     Blood pressure is controlled per pt.  No latex allergies.    PAIN:  Are you having pain? Yes: NPRS scale: 8/10 currently Pain location: B anterior arms, just below the shoulder joint.  Pain description: sore Aggravating factors: reaching up, behind her back, behind her head, laying on her R and L sides. Relieving factors: using other  hand to help the hurting arm down when she raises it up too high.    PRECAUTIONS: no known precautions  RED FLAGS: Cervical red flags: Dysphagia No, Dysmetria No, Diplopia No, Nystagmus No, and Nausea No, Bowel or bladder incontinence: No, and Cauda equina syndrome: No     WEIGHT BEARING RESTRICTIONS: No  FALLS:  Has patient fallen in last 6 months? No  LIVING ENVIRONMENT: Lives with: lives with their daughter Lives in: House/apartment Stairs: No Has following equipment at home: None  OCCUPATION: Part time with home health.   PLOF: Independent  PATIENT GOALS: Make shoulders feel better.   NEXT MD VISIT: none scheduled yet  OBJECTIVE:  Note: Objective measures were completed at Evaluation unless otherwise noted.  DIAGNOSTIC FINDINGS:    PATIENT SURVEYS:  UEFS  Extreme difficulty/unable (0), Quite a bit of difficulty (1), Moderate difficulty (2), Little difficulty (3), No difficulty (4) Survey date:  03/01/2024  Any of your usual work, household or school activities 1  2. Your usual hobbies, recreational/sport activities 1   3. Lifting a bag of groceries to waist level 0   4. Lifting a bag of groceries above your head 0  5. Grooming your hair 1  6. Pushing up on your hands (I.e. from bathtub or chair) 1  7. Preparing food (I.e. peeling/cutting) 3  8.  Driving  3  9. Vacuuming, sweeping, or raking 2  10. Dressing  3  11. Doing up buttons 3  12. Using tools/appliances 0  13. Opening doors 2  14. Cleaning  2  15. Tying or lacing shoes 0  16. Sleeping  0  17. Laundering clothes (I.e. washing, ironing, folding) 3  18. Opening a jar 3  19. Throwing a ball 0  20. Carrying a small suitcase with your affected limb.  0  Score total:  28/80     COGNITION: Overall cognitive status: Within functional limits for tasks assessed  SENSATION: Tinging at tips of L 3-4 digits of L hand.  Tingling R and L anterior arm   POSTURE: slightly forward neck, R shoulder lower, R scapular protraction compared to L  PALPATION: TTP R and L subacromial area (anterior and lateral), R and L anterior arm  Muscle tension B upper trap, B cervical paraspinal muscles  L shoulder joint: decreased inferior and posterior joint mobility  Feels good with L posterior shoulder joint mobilization   R shoulder joint: decreased inferior and posterior joint mobility       CERVICAL ROM:   Active ROM A/PROM (deg) eval  Flexion Full with B posterior shoulder pulling  Extension WFL with B anterior lateral neck pain  Right lateral flexion WFL with L lateral neck pull  Left lateral flexion WFL  Right rotation [facility] with R inferior lateral neck pain  Left rotation WFL with L inferior lateral neck pain   (Blank rows = not tested)  UPPER EXTREMITY ROM:  Active ROM Right eval Left eval  Shoulder flexion 76 (93 AAROM with stiff end feel, anterior shoulder pain) 56 (60 AAROM, empty to stiff end feel, L lateral proximal arm pain)  Shoulder extension    Shoulder abduction 78 (86 AAROM, stiff and painful end feel) 40 (50 degrees AAROM with stiff and painful end feel); L shoulder shrug and R trunk side bend compensation  Shoulder adduction    Shoulder extension    Shoulder internal rotation 55 stiff and painful end feel (At 32 deg abduction)   88, stiff end feel   Shoulder  external rotation 82 stiff and painful end feel At 32 deg abduction)  74 (stiff and painful end feel)  Elbow flexion    Elbow extension    Wrist flexion    Wrist extension    Wrist ulnar deviation    Wrist radial deviation    Wrist pronation    Wrist supination     (Blank rows = not tested)  UPPER EXTREMITY MMT:  MMT Right eval Left eval  Shoulder flexion 4 4  Shoulder extension    Shoulder abduction 4 4-  Shoulder adduction    Shoulder extension    Shoulder internal rotation 4- 4-  Shoulder external rotation 4- 4  Middle trapezius    Lower trapezius (seated manually resisted)  4- 3+  Elbow flexion    Elbow extension    Wrist flexion    Wrist extension    Wrist ulnar deviation    Wrist radial deviation    Wrist pronation    Wrist supination    Grip strength     (Blank rows = not tested)  CERVICAL SPECIAL TESTS:    FUNCTIONAL TESTS:   Information above represents examination and objective data collected at Initial evaluation.   TREATMENT DATE: 04/26/24  Subjective: Patient reports 7/10 NPS in bilateral shoulders. She reports that when sleeps at night lays on her shoulders it increases the pain.  No further questions or concerns.                                                                             Therapeutic Exercise: UBE - 5 min - 2.5 min Fwd, 2.5 min Retro - Level 10-5 for UE  strength and muscular endurance; PT manually adjusted resistance throughout to patient's tolerance.    Seated Shoulder Row    1 x 10 - 20#    2 x 10 - 25#   Standing Posterior Delt Flys    3 x 10 - Red TB     Seated Shoulder ER    R/L: 1 x 10 - 3# DB    R/L: 1 x 10 - 2# DB    [place] Chest Press with weighted dowel    2 x 10 (6# dowel)    Manual Therapy (with intent to improve muscular tension and decrease pain from GHJ impingement):  Light STM applied to upper trapezius and cervical paraspinal, posterior and lateral deltoid musculature bilaterally for  pain modulation and decrease muscle tension. Myofascial release, trigger point and contract and relax techniques utilized. Pt endorsed improvements in pain following intervention.   Right & Left GHJ P-A Mobilization   60s/bout x 4 bouts x Grade III   Right and L GHJ Inf Mobilization   60s/bout x 4 bouts x Grade III   Right and Left GHJ Distraction Mobilization  60s/bout x 2 bouts ea UE   PATIENT EDUCATION:  Education details: There-ex, HEP Person educated: Patient Education method: Explanation, Demonstration, Tactile cues, Verbal cues, and Handouts Education comprehension: verbalized understanding and returned demonstration  HOME EXERCISE PROGRAM: Access Code: [email or web address] URL: [email or web address] Date: 04/16/2024 Prepared by: Lonni Pall  Exercises - Supine Scapular Retraction  - 2 x daily - 7 x weekly - 3 sets - 10 reps -  5 seconds hold - Seated Scapular Retraction  - 3 x daily - 7 x weekly - 3 sets - 10 reps - 5 seconds hold - Sidelying Shoulder ER with Towel and Dumbbell  - 1 x daily - 3-4 x weekly - 2-3 sets - 10-15 reps - Standing Shoulder Row with Anchored Resistance  - 1 x daily - 3-4 x weekly - 2-3 sets - 10-15 reps - Shoulder External Rotation and Scapular Retraction with Resistance  - 1 x daily - 3-4 x weekly - 2-3 sets - 10-15 reps - Supine Shoulder Flexion Extension AAROM with Dowel  - 1 x daily - 7 x weekly - 2-3 sets - 10-12 reps  Access Code: [email or web address] URL: [email or web address] Date: 04/06/2024 Prepared by: Emil Glassman  Exercises - Supine Scapular Retraction  - 2 x daily - 7 x weekly - 3 sets - 10 reps - 5 seconds hold - Seated Scapular Retraction  - 3 x daily - 7 x weekly - 3 sets - 10 reps - 5 seconds hold    ASSESSMENT:  CLINICAL IMPRESSION: Continued PT POC focused on improving shoulder strengthening and   Continued PT POC focused on shoulder strengthening. Patient limited with PT exercises due to pain in the R/L  Shoulder. Reassessed progress towards PT goals (see below at goals). Although her AROM has improved she still presents with moderate to severe pain limiting upward scapular reach > 120 deg. Patient with deficits in external and internal rotation limiting ability to reach behind her back.  Per subjective score on UEFI she has signifcantly improved with functional activities however she still reports significant pain with all of those activities. Manual techniques provided to decrease pain and addressed. GHJ mobilizations with capsular end feel with pain reported at end range; pain improved with additional bouts in posterior and inferior direction. Without PT interventions patient at risk of further reinjury of bilateral UE.  Based on today's performance the patient will benefit from continued skilled physical therapy in order to address her current deficits and maximize return to PLOF.       OBJECTIVE IMPAIRMENTS: decreased ROM, decreased strength, impaired UE functional use, improper body mechanics, postural dysfunction, and pain.   ACTIVITY LIMITATIONS: carrying, lifting, sleeping, bed mobility, bathing, toileting, dressing, reach over head, and hygiene/grooming  PARTICIPATION LIMITATIONS:   PERSONAL FACTORS: Fitness, Profession, and 1 comorbidity: DM are also affecting patient's functional outcome.   REHAB POTENTIAL: Fair    CLINICAL DECISION MAKING: Stable/uncomplicated  EVALUATION COMPLEXITY: Low   GOALS: Goals reviewed with patient? Yes  SHORT TERM GOALS: Target date: 03/16/2024  Pt will be independent with her initial HEP to decrease pain, improve ROM, strength, function, and ability to reach and lay on her sides more comfortably.  Baseline: Pt has not yet started her initial HEP (03/01/2024); no questions (03/26/2024) Goal status: MET     LONG TERM GOALS: Target date: 06/21/2024  Pt will have a decrease in B shoulder pain to 3/10 or less at worst to promote ability to reach, as  well as lay on her R and L sides for sleep more comfortably.  Baseline: R anterior shoulder and arm 8/10, L anterior shoulder and arm 9/10 at most for the past 3 months (03/01/2024); 04/24/2024: 9/10  Goal status: Progressing  2.  Pt will improve B shoulder flexion and abduction AROM by at least 30 degrees to promote ability to reach more comfortably.  Baseline:  Active ROM Right eval Left eval R 04/24/24 L : 04/25/2023  Shoulder  flexion 76 (93 AAROM with stiff end feel, anterior shoulder pain) 56 (60 AAROM, empty to stiff end feel, L lateral proximal arm pain) AROM: 120 AROM: 95   Shoulder abduction 78 (86 AAROM, stiff and painful end feel) 40 (50 degrees AAROM with stiff and painful end feel); L shoulder shrug and R trunk side bend compensation AROM: 120 AROM: 110    Goal status: Goal Progressing  3.  Pt will improve her R shoulder ER and R and L shoulder IR AROM (at at least 75 degrees abduction) by at least 10 degrees to promote ability to reach behind her back and head more comfortably.  Baseline:  Active ROM Right eval Left eval Right 04/24/2024 At 90 deg abd Left 04/24/2024 At 90 deg abd  Shoulder internal rotation 55 stiff and painful end feel (At 32 deg abduction)   88, stiff end feel 80 deg  80 deg   Shoulder external rotation 82 stiff and painful end feel At 32 deg abduction)  74 (stiff and painful end feel) 55 deg  40 deg   Goal status: Progressing  4.  Pt will improve her UEFI score by at least 10 points as a demonstration of improved function.  Baseline: 28/80 (03/01/2024); 60/80 (04/24/24) Goal status: Goal Met    PLAN:  PT FREQUENCY: 1-2x/week  PT DURATION: 8 weeks  PLANNED INTERVENTIONS: 97110-Therapeutic exercises, 97530- Therapeutic activity, 97112- Neuromuscular re-education, 97535- Self Care, [id number]- Manual therapy, [id number]- Electrical stimulation (unattended), [phone]- Ionotophoresis 4mg /ml Dexamethasone, 79439 (1-2 muscles), [id number] (3+ muscles)- Dry Needling,  Patient/Family education, Joint mobilization, and Spinal mobilization  PLAN FOR NEXT SESSION:  Manual techniques such as joint mobilization; thoracic extension, scapular and ER muscle strengthening, modalities PRN  Lonni Pall PT, DPT Physical Therapist- [facility]  04/26/24, 8:56 AM     "

## 2024-04-30 ENCOUNTER — Ambulatory Visit

## 2024-05-01 ENCOUNTER — Ambulatory Visit

## 2024-05-03 ENCOUNTER — Ambulatory Visit

## 2024-05-08 ENCOUNTER — Ambulatory Visit

## 2024-05-09 ENCOUNTER — Ambulatory Visit

## 2024-05-10 ENCOUNTER — Ambulatory Visit

## 2024-05-14 ENCOUNTER — Ambulatory Visit

## 2024-05-15 ENCOUNTER — Ambulatory Visit

## 2024-05-21 ENCOUNTER — Ambulatory Visit

## 2024-05-23 ENCOUNTER — Ambulatory Visit

## 2024-05-28 ENCOUNTER — Ambulatory Visit

## 2024-05-30 ENCOUNTER — Ambulatory Visit

## 2024-06-04 ENCOUNTER — Ambulatory Visit

## 2024-06-07 ENCOUNTER — Ambulatory Visit

## 2024-06-11 ENCOUNTER — Ambulatory Visit

## 2024-06-13 ENCOUNTER — Ambulatory Visit

## 2024-06-19 ENCOUNTER — Ambulatory Visit

## 2024-06-19 ENCOUNTER — Ambulatory Visit: Admit: 2024-06-19 | Admitting: Gastroenterology

## 2024-06-21 ENCOUNTER — Ambulatory Visit

## 2024-07-13 ENCOUNTER — Encounter: Admitting: Family Medicine

## 2024-07-16 ENCOUNTER — Encounter: Admitting: Family Medicine
